# Patient Record
Sex: Female | Born: 1955
Health system: Southern US, Community
[De-identification: ages and names within clinical notes are randomized; demographics above are authoritative.]

## PROBLEM LIST (undated history)

## (undated) DIAGNOSIS — D649 Anemia, unspecified: Secondary | ICD-10-CM

## (undated) DIAGNOSIS — R519 Headache, unspecified: Secondary | ICD-10-CM

## (undated) DIAGNOSIS — Z789 Other specified health status: Secondary | ICD-10-CM

## (undated) DIAGNOSIS — G8929 Other chronic pain: Secondary | ICD-10-CM

## (undated) DIAGNOSIS — J45909 Unspecified asthma, uncomplicated: Secondary | ICD-10-CM

## (undated) DIAGNOSIS — I499 Cardiac arrhythmia, unspecified: Secondary | ICD-10-CM

## (undated) DIAGNOSIS — R51 Headache: Secondary | ICD-10-CM

## (undated) DIAGNOSIS — J302 Other seasonal allergic rhinitis: Secondary | ICD-10-CM

## (undated) DIAGNOSIS — M1990 Unspecified osteoarthritis, unspecified site: Secondary | ICD-10-CM

## (undated) HISTORY — PX: WISDOM TOOTH EXTRACTION: SHX21

## (undated) HISTORY — DX: Headache: R51

## (undated) HISTORY — DX: Headache, unspecified: R51.9

## (undated) HISTORY — PX: COLONOSCOPY: SHX174

## (undated) HISTORY — DX: Other chronic pain: G89.29

## (undated) HISTORY — DX: Unspecified asthma, uncomplicated: J45.909

## (undated) HISTORY — DX: Anemia, unspecified: D64.9

---

## 1975-08-27 DIAGNOSIS — I499 Cardiac arrhythmia, unspecified: Secondary | ICD-10-CM

## 1975-08-27 HISTORY — DX: Cardiac arrhythmia, unspecified: I49.9

## 1999-05-23 ENCOUNTER — Other Ambulatory Visit: Admission: RE | Admit: 1999-05-23 | Discharge: 1999-05-23 | Payer: Self-pay | Admitting: Obstetrics and Gynecology

## 2000-07-15 ENCOUNTER — Other Ambulatory Visit: Admission: RE | Admit: 2000-07-15 | Discharge: 2000-07-15 | Payer: Self-pay | Admitting: Obstetrics and Gynecology

## 2000-07-23 ENCOUNTER — Encounter: Payer: Self-pay | Admitting: Obstetrics and Gynecology

## 2000-07-23 ENCOUNTER — Encounter: Admission: RE | Admit: 2000-07-23 | Discharge: 2000-07-23 | Payer: Self-pay | Admitting: Obstetrics and Gynecology

## 2000-10-14 ENCOUNTER — Other Ambulatory Visit: Admission: RE | Admit: 2000-10-14 | Discharge: 2000-10-14 | Payer: Self-pay | Admitting: Obstetrics and Gynecology

## 2000-11-17 ENCOUNTER — Encounter: Payer: Self-pay | Admitting: Gastroenterology

## 2000-11-17 ENCOUNTER — Encounter: Admission: RE | Admit: 2000-11-17 | Discharge: 2000-11-17 | Payer: Self-pay | Admitting: Gastroenterology

## 2001-08-26 HISTORY — PX: OVARIAN CYST SURGERY: SHX726

## 2002-11-04 ENCOUNTER — Encounter: Payer: Self-pay | Admitting: Obstetrics and Gynecology

## 2002-11-04 ENCOUNTER — Ambulatory Visit (HOSPITAL_COMMUNITY): Admission: RE | Admit: 2002-11-04 | Discharge: 2002-11-04 | Payer: Self-pay | Admitting: Obstetrics and Gynecology

## 2003-08-30 ENCOUNTER — Other Ambulatory Visit: Admission: RE | Admit: 2003-08-30 | Discharge: 2003-08-30 | Payer: Self-pay | Admitting: Obstetrics and Gynecology

## 2003-09-15 ENCOUNTER — Encounter: Admission: RE | Admit: 2003-09-15 | Discharge: 2003-09-15 | Payer: Self-pay | Admitting: Obstetrics and Gynecology

## 2004-11-23 ENCOUNTER — Ambulatory Visit (HOSPITAL_COMMUNITY): Admission: RE | Admit: 2004-11-23 | Discharge: 2004-11-23 | Payer: Self-pay | Admitting: Dermatology

## 2004-12-21 ENCOUNTER — Other Ambulatory Visit: Admission: RE | Admit: 2004-12-21 | Discharge: 2004-12-21 | Payer: Self-pay | Admitting: Obstetrics and Gynecology

## 2006-01-16 ENCOUNTER — Other Ambulatory Visit: Admission: RE | Admit: 2006-01-16 | Discharge: 2006-01-16 | Payer: Self-pay | Admitting: Obstetrics and Gynecology

## 2006-02-11 ENCOUNTER — Encounter: Admission: RE | Admit: 2006-02-11 | Discharge: 2006-02-11 | Payer: Self-pay | Admitting: Obstetrics and Gynecology

## 2006-11-18 ENCOUNTER — Ambulatory Visit (HOSPITAL_COMMUNITY): Admission: RE | Admit: 2006-11-18 | Discharge: 2006-11-18 | Payer: Self-pay | Admitting: Obstetrics and Gynecology

## 2008-10-20 ENCOUNTER — Other Ambulatory Visit: Admission: RE | Admit: 2008-10-20 | Discharge: 2008-10-20 | Payer: Self-pay | Admitting: Obstetrics and Gynecology

## 2008-11-01 ENCOUNTER — Encounter: Admission: RE | Admit: 2008-11-01 | Discharge: 2008-11-01 | Payer: Self-pay | Admitting: Obstetrics and Gynecology

## 2009-11-15 ENCOUNTER — Other Ambulatory Visit: Admission: RE | Admit: 2009-11-15 | Discharge: 2009-11-15 | Payer: Self-pay | Admitting: Obstetrics and Gynecology

## 2013-06-09 ENCOUNTER — Other Ambulatory Visit: Payer: Self-pay | Admitting: Obstetrics & Gynecology

## 2013-06-09 ENCOUNTER — Other Ambulatory Visit (HOSPITAL_COMMUNITY)
Admission: RE | Admit: 2013-06-09 | Discharge: 2013-06-09 | Disposition: A | Discharge status: Home / Self Care | Payer: 59 | Source: Ambulatory Visit | Attending: Obstetrics & Gynecology | Admitting: Obstetrics & Gynecology

## 2013-06-09 DIAGNOSIS — Z01419 Encounter for gynecological examination (general) (routine) without abnormal findings: Secondary | ICD-10-CM | POA: Insufficient documentation

## 2013-06-09 DIAGNOSIS — Z1151 Encounter for screening for human papillomavirus (HPV): Secondary | ICD-10-CM | POA: Insufficient documentation

## 2014-08-04 ENCOUNTER — Other Ambulatory Visit: Payer: Self-pay

## 2014-08-04 DIAGNOSIS — Z1231 Encounter for screening mammogram for malignant neoplasm of breast: Secondary | ICD-10-CM

## 2014-08-25 ENCOUNTER — Ambulatory Visit: Payer: 59

## 2014-08-26 HISTORY — PX: BREAST EXCISIONAL BIOPSY: SUR124

## 2014-09-08 ENCOUNTER — Ambulatory Visit
Admission: RE | Admit: 2014-09-08 | Discharge: 2014-09-08 | Disposition: A | Discharge status: Home / Self Care | Payer: 59 | Source: Ambulatory Visit

## 2014-09-08 DIAGNOSIS — Z1231 Encounter for screening mammogram for malignant neoplasm of breast: Secondary | ICD-10-CM

## 2014-09-09 ENCOUNTER — Other Ambulatory Visit: Payer: Self-pay | Admitting: Obstetrics & Gynecology

## 2014-09-09 DIAGNOSIS — R928 Other abnormal and inconclusive findings on diagnostic imaging of breast: Secondary | ICD-10-CM

## 2014-09-21 ENCOUNTER — Ambulatory Visit
Admission: RE | Admit: 2014-09-21 | Discharge: 2014-09-21 | Disposition: A | Discharge status: Home / Self Care | Payer: 59 | Source: Ambulatory Visit | Attending: Obstetrics & Gynecology | Admitting: Obstetrics & Gynecology

## 2014-09-21 ENCOUNTER — Other Ambulatory Visit: Payer: Self-pay | Admitting: Obstetrics & Gynecology

## 2014-09-21 DIAGNOSIS — R928 Other abnormal and inconclusive findings on diagnostic imaging of breast: Secondary | ICD-10-CM

## 2014-10-03 ENCOUNTER — Other Ambulatory Visit: Payer: Self-pay | Admitting: Obstetrics & Gynecology

## 2014-10-03 DIAGNOSIS — R928 Other abnormal and inconclusive findings on diagnostic imaging of breast: Secondary | ICD-10-CM

## 2014-10-07 ENCOUNTER — Ambulatory Visit
Admission: RE | Admit: 2014-10-07 | Discharge: 2014-10-07 | Disposition: A | Discharge status: Home / Self Care | Payer: 59 | Source: Ambulatory Visit | Attending: Obstetrics & Gynecology | Admitting: Obstetrics & Gynecology

## 2014-10-07 DIAGNOSIS — R928 Other abnormal and inconclusive findings on diagnostic imaging of breast: Secondary | ICD-10-CM

## 2014-10-24 ENCOUNTER — Other Ambulatory Visit (INDEPENDENT_AMBULATORY_CARE_PROVIDER_SITE_OTHER): Payer: Self-pay | Admitting: General Surgery

## 2014-10-24 ENCOUNTER — Other Ambulatory Visit (INDEPENDENT_AMBULATORY_CARE_PROVIDER_SITE_OTHER): Payer: Self-pay

## 2014-10-24 DIAGNOSIS — N6452 Nipple discharge: Secondary | ICD-10-CM

## 2014-10-26 ENCOUNTER — Other Ambulatory Visit: Payer: 59

## 2014-10-31 ENCOUNTER — Other Ambulatory Visit (INDEPENDENT_AMBULATORY_CARE_PROVIDER_SITE_OTHER): Payer: Self-pay | Admitting: General Surgery

## 2014-10-31 DIAGNOSIS — N6021 Fibroadenosis of right breast: Secondary | ICD-10-CM

## 2014-11-01 ENCOUNTER — Other Ambulatory Visit: Payer: Self-pay | Admitting: General Surgery

## 2014-11-01 DIAGNOSIS — N6021 Fibroadenosis of right breast: Secondary | ICD-10-CM

## 2014-11-02 ENCOUNTER — Encounter (HOSPITAL_BASED_OUTPATIENT_CLINIC_OR_DEPARTMENT_OTHER): Payer: Self-pay | Admitting: *Deleted

## 2014-11-02 NOTE — Progress Notes (Signed)
Pt fairly nervous-no pcp-just GYN-no meds Hx cardiac arrythmia age 59-was low K No meds-no issue since No labs needed-did have labs 12/15 at gyn

## 2014-11-07 ENCOUNTER — Encounter (HOSPITAL_BASED_OUTPATIENT_CLINIC_OR_DEPARTMENT_OTHER)
Admission: RE | Disposition: A | Discharge status: Home / Self Care | Payer: Self-pay | Source: Ambulatory Visit | Attending: General Surgery

## 2014-11-07 ENCOUNTER — Ambulatory Visit (HOSPITAL_BASED_OUTPATIENT_CLINIC_OR_DEPARTMENT_OTHER): Payer: 59

## 2014-11-07 ENCOUNTER — Ambulatory Visit
Admission: RE | Admit: 2014-11-07 | Discharge: 2014-11-07 | Disposition: A | Discharge status: Home / Self Care | Payer: 59 | Source: Ambulatory Visit | Attending: General Surgery | Admitting: General Surgery

## 2014-11-07 ENCOUNTER — Ambulatory Visit (HOSPITAL_BASED_OUTPATIENT_CLINIC_OR_DEPARTMENT_OTHER)
Admission: RE | Admit: 2014-11-07 | Discharge: 2014-11-07 | Disposition: A | Discharge status: Home / Self Care | Payer: 59 | Source: Ambulatory Visit | Attending: General Surgery | Admitting: General Surgery

## 2014-11-07 ENCOUNTER — Encounter (HOSPITAL_BASED_OUTPATIENT_CLINIC_OR_DEPARTMENT_OTHER): Payer: Self-pay | Admitting: *Deleted

## 2014-11-07 DIAGNOSIS — Z880 Allergy status to penicillin: Secondary | ICD-10-CM | POA: Insufficient documentation

## 2014-11-07 DIAGNOSIS — Z87891 Personal history of nicotine dependence: Secondary | ICD-10-CM | POA: Diagnosis not present

## 2014-11-07 DIAGNOSIS — N6011 Diffuse cystic mastopathy of right breast: Secondary | ICD-10-CM | POA: Diagnosis not present

## 2014-11-07 DIAGNOSIS — D241 Benign neoplasm of right breast: Secondary | ICD-10-CM | POA: Insufficient documentation

## 2014-11-07 DIAGNOSIS — N6091 Unspecified benign mammary dysplasia of right breast: Secondary | ICD-10-CM | POA: Diagnosis not present

## 2014-11-07 DIAGNOSIS — G43909 Migraine, unspecified, not intractable, without status migrainosus: Secondary | ICD-10-CM | POA: Insufficient documentation

## 2014-11-07 DIAGNOSIS — J45909 Unspecified asthma, uncomplicated: Secondary | ICD-10-CM | POA: Insufficient documentation

## 2014-11-07 DIAGNOSIS — M1990 Unspecified osteoarthritis, unspecified site: Secondary | ICD-10-CM | POA: Insufficient documentation

## 2014-11-07 DIAGNOSIS — Z886 Allergy status to analgesic agent status: Secondary | ICD-10-CM | POA: Insufficient documentation

## 2014-11-07 DIAGNOSIS — N6021 Fibroadenosis of right breast: Secondary | ICD-10-CM

## 2014-11-07 DIAGNOSIS — N6489 Other specified disorders of breast: Secondary | ICD-10-CM | POA: Insufficient documentation

## 2014-11-07 DIAGNOSIS — Z888 Allergy status to other drugs, medicaments and biological substances status: Secondary | ICD-10-CM | POA: Diagnosis not present

## 2014-11-07 HISTORY — DX: Other seasonal allergic rhinitis: J30.2

## 2014-11-07 HISTORY — PX: BREAST LUMPECTOMY WITH RADIOACTIVE SEED LOCALIZATION: SHX6424

## 2014-11-07 HISTORY — DX: Unspecified osteoarthritis, unspecified site: M19.90

## 2014-11-07 HISTORY — DX: Cardiac arrhythmia, unspecified: I49.9

## 2014-11-07 HISTORY — DX: Other specified health status: Z78.9

## 2014-11-07 LAB — POCT HEMOGLOBIN-HEMACUE: Hemoglobin: 14.8 g/dL (ref 12.0–15.0)

## 2014-11-07 SURGERY — BREAST LUMPECTOMY WITH RADIOACTIVE SEED LOCALIZATION
Anesthesia: General | Site: Breast | Laterality: Right

## 2014-11-07 MED ORDER — DEXAMETHASONE SODIUM PHOSPHATE 4 MG/ML IJ SOLN
INTRAMUSCULAR | Status: DC | PRN
  Administered 2014-11-07: 10 mg via INTRAVENOUS

## 2014-11-07 MED ORDER — OXYCODONE HCL 5 MG PO TABS
5.0000 mg | ORAL_TABLET | Freq: Once | ORAL | Status: AC | PRN
  Administered 2014-11-07: 5 mg via ORAL

## 2014-11-07 MED ORDER — CEFAZOLIN SODIUM-DEXTROSE 2-3 GM-% IV SOLR
INTRAVENOUS | Status: AC
  Filled 2014-11-07: qty 50

## 2014-11-07 MED ORDER — OXYCODONE-ACETAMINOPHEN 5-325 MG PO TABS: 1.0000 | ORAL_TABLET | ORAL | Status: DC | PRN

## 2014-11-07 MED ORDER — BUPIVACAINE-EPINEPHRINE (PF) 0.25% -1:200000 IJ SOLN
INTRAMUSCULAR | Status: AC
  Filled 2014-11-07: qty 30

## 2014-11-07 MED ORDER — ONDANSETRON HCL 4 MG/2ML IJ SOLN: 4.0000 mg | Freq: Four times a day (QID) | INTRAMUSCULAR | Status: DC | PRN

## 2014-11-07 MED ORDER — FENTANYL CITRATE 0.05 MG/ML IJ SOLN
25.0000 ug | INTRAMUSCULAR | Status: DC | PRN
  Administered 2014-11-07: 50 ug via INTRAVENOUS
  Administered 2014-11-07: 25 ug via INTRAVENOUS

## 2014-11-07 MED ORDER — FENTANYL CITRATE 0.05 MG/ML IJ SOLN: 50.0000 ug | INTRAMUSCULAR | Status: DC | PRN

## 2014-11-07 MED ORDER — BUPIVACAINE-EPINEPHRINE (PF) 0.25% -1:200000 IJ SOLN
INTRAMUSCULAR | Status: DC | PRN
  Administered 2014-11-07: 18 mL via PERINEURAL

## 2014-11-07 MED ORDER — FENTANYL CITRATE 0.05 MG/ML IJ SOLN
INTRAMUSCULAR | Status: AC
  Filled 2014-11-07: qty 2

## 2014-11-07 MED ORDER — MIDAZOLAM HCL 2 MG/2ML IJ SOLN: 1.0000 mg | INTRAMUSCULAR | Status: DC | PRN

## 2014-11-07 MED ORDER — PROPOFOL 10 MG/ML IV BOLUS
INTRAVENOUS | Status: DC | PRN
  Administered 2014-11-07: 180 mg via INTRAVENOUS

## 2014-11-07 MED ORDER — CEFAZOLIN SODIUM-DEXTROSE 2-3 GM-% IV SOLR
INTRAVENOUS | Status: DC | PRN
  Administered 2014-11-07: 2 g via INTRAVENOUS

## 2014-11-07 MED ORDER — FENTANYL CITRATE 0.05 MG/ML IJ SOLN
INTRAMUSCULAR | Status: AC
  Filled 2014-11-07: qty 6

## 2014-11-07 MED ORDER — OXYCODONE HCL 5 MG PO TABS
ORAL_TABLET | ORAL | Status: AC
  Filled 2014-11-07: qty 1

## 2014-11-07 MED ORDER — LIDOCAINE HCL (CARDIAC) 20 MG/ML IV SOLN
INTRAVENOUS | Status: DC | PRN
  Administered 2014-11-07: 80 mg via INTRAVENOUS

## 2014-11-07 MED ORDER — MIDAZOLAM HCL 5 MG/5ML IJ SOLN
INTRAMUSCULAR | Status: DC | PRN
  Administered 2014-11-07: 2 mg via INTRAVENOUS

## 2014-11-07 MED ORDER — BUPIVACAINE HCL (PF) 0.25 % IJ SOLN
INTRAMUSCULAR | Status: AC
  Filled 2014-11-07: qty 30

## 2014-11-07 MED ORDER — LACTATED RINGERS IV SOLN
INTRAVENOUS | Status: DC
  Administered 2014-11-07 (×3): via INTRAVENOUS

## 2014-11-07 MED ORDER — ONDANSETRON HCL 4 MG/2ML IJ SOLN
INTRAMUSCULAR | Status: DC | PRN
  Administered 2014-11-07: 4 mg via INTRAVENOUS

## 2014-11-07 MED ORDER — MIDAZOLAM HCL 2 MG/2ML IJ SOLN
INTRAMUSCULAR | Status: AC
  Filled 2014-11-07: qty 2

## 2014-11-07 MED ORDER — FENTANYL CITRATE 0.05 MG/ML IJ SOLN
INTRAMUSCULAR | Status: DC | PRN
  Administered 2014-11-07 (×2): 50 ug via INTRAVENOUS

## 2014-11-07 MED ORDER — OXYCODONE HCL 5 MG/5ML PO SOLN: 5.0000 mg | Freq: Once | ORAL | Status: AC | PRN

## 2014-11-07 SURGICAL SUPPLY — 42 items
APPLIER CLIP 9.375 MED OPEN (MISCELLANEOUS)
APR CLP MED 9.3 20 MLT OPN (MISCELLANEOUS)
BLADE SURG 15 STRL LF DISP TIS (BLADE) ×1 IMPLANT
BLADE SURG 15 STRL SS (BLADE) ×2
CANISTER SUC SOCK COL 7IN (MISCELLANEOUS) ×2 IMPLANT
CANISTER SUCT 1200ML W/VALVE (MISCELLANEOUS) ×2 IMPLANT
CHLORAPREP W/TINT 26ML (MISCELLANEOUS) ×2 IMPLANT
CLIP APPLIE 9.375 MED OPEN (MISCELLANEOUS) ×1 IMPLANT
COVER BACK TABLE 60X90IN (DRAPES) ×2 IMPLANT
COVER MAYO STAND STRL (DRAPES) ×2 IMPLANT
COVER PROBE W GEL 5X96 (DRAPES) ×2 IMPLANT
DECANTER SPIKE VIAL GLASS SM (MISCELLANEOUS) IMPLANT
DEVICE DUBIN W/COMP PLATE 8390 (MISCELLANEOUS) ×2 IMPLANT
DRAPE LAPAROSCOPIC ABDOMINAL (DRAPES) ×1 IMPLANT
DRAPE UTILITY XL STRL (DRAPES) ×3 IMPLANT
ELECT COATED BLADE 2.86 ST (ELECTRODE) ×2 IMPLANT
ELECT REM PT RETURN 9FT ADLT (ELECTROSURGICAL) ×2
ELECTRODE REM PT RTRN 9FT ADLT (ELECTROSURGICAL) ×1 IMPLANT
GLOVE BIO SURGEON STRL SZ 6.5 (GLOVE) ×1 IMPLANT
GLOVE BIO SURGEON STRL SZ7.5 (GLOVE) ×3 IMPLANT
GLOVE BIOGEL PI IND STRL 7.0 (GLOVE) IMPLANT
GLOVE BIOGEL PI INDICATOR 7.0 (GLOVE) ×1
GLOVE ECLIPSE 6.5 STRL STRAW (GLOVE) ×1 IMPLANT
GOWN STRL REUS W/ TWL LRG LVL3 (GOWN DISPOSABLE) ×2 IMPLANT
GOWN STRL REUS W/TWL LRG LVL3 (GOWN DISPOSABLE) ×4
KIT MARKER MARGIN INK (KITS) ×1 IMPLANT
LIQUID BAND (GAUZE/BANDAGES/DRESSINGS) ×2 IMPLANT
NDL HYPO 25X1 1.5 SAFETY (NEEDLE) IMPLANT
NEEDLE HYPO 25X1 1.5 SAFETY (NEEDLE) ×2 IMPLANT
NS IRRIG 1000ML POUR BTL (IV SOLUTION) ×1 IMPLANT
PACK BASIN DAY SURGERY FS (CUSTOM PROCEDURE TRAY) ×2 IMPLANT
PENCIL BUTTON HOLSTER BLD 10FT (ELECTRODE) ×2 IMPLANT
SLEEVE SCD COMPRESS KNEE MED (MISCELLANEOUS) ×2 IMPLANT
SPONGE LAP 18X18 X RAY DECT (DISPOSABLE) ×2 IMPLANT
SUT MON AB 4-0 PC3 18 (SUTURE) ×1 IMPLANT
SUT SILK 2 0 SH (SUTURE) ×1 IMPLANT
SUT VICRYL 3-0 CR8 SH (SUTURE) ×2 IMPLANT
SYR CONTROL 10ML LL (SYRINGE) ×1 IMPLANT
TOWEL OR 17X24 6PK STRL BLUE (TOWEL DISPOSABLE) ×2 IMPLANT
TOWEL OR NON WOVEN STRL DISP B (DISPOSABLE) ×2 IMPLANT
TUBE CONNECTING 20X1/4 (TUBING) ×2 IMPLANT
YANKAUER SUCT BULB TIP NO VENT (SUCTIONS) ×1 IMPLANT

## 2014-11-07 NOTE — Transfer of Care (Signed)
Immediate Anesthesia Transfer of Care Note  Patient: Rachel Wade  Procedure(s) Performed: Procedure(s): BREAST LUMPECTOMY WITH RADIOACTIVE SEED LOCALIZATION (Right)  Patient Location: PACU  Anesthesia Type:General  Level of Consciousness: oriented and patient cooperative  Airway & Oxygen Therapy: Patient Spontanous Breathing and Patient connected to face mask oxygen  Post-op Assessment: Report given to RN and Post -op Vital signs reviewed and stable  Post vital signs: Reviewed and stable  Last Vitals:  Filed Vitals:   11/07/14 1311  BP: 125/72  Pulse: 77  Temp: 36.8 C  Resp: 20    Complications: No apparent anesthesia complications

## 2014-11-07 NOTE — Anesthesia Procedure Notes (Signed)
Procedure Name: LMA Insertion Date/Time: 11/07/2014 2:42 PM Performed by: Lyndee Leo Pre-anesthesia Checklist: Patient identified, Emergency Drugs available, Suction available and Patient being monitored Patient Re-evaluated:Patient Re-evaluated prior to inductionOxygen Delivery Method: Circle System Utilized Preoxygenation: Pre-oxygenation with 100% oxygen Intubation Type: IV induction Ventilation: Mask ventilation without difficulty LMA: LMA inserted LMA Size: 4.0 Number of attempts: 1 Airway Equipment and Method: Bite block Placement Confirmation: positive ETCO2 Tube secured with: Tape Dental Injury: Teeth and Oropharynx as per pre-operative assessment

## 2014-11-07 NOTE — Discharge Instructions (Signed)

## 2014-11-07 NOTE — Anesthesia Preprocedure Evaluation (Signed)
Anesthesia Evaluation  Patient identified by MRN, date of birth, ID band Patient awake    Reviewed: Allergy & Precautions, NPO status , Patient's Chart, lab work & pertinent test results  Airway Mallampati: II   Neck ROM: full    Dental   Pulmonary former smoker,          Cardiovascular negative cardio ROS      Neuro/Psych    GI/Hepatic   Endo/Other    Renal/GU      Musculoskeletal  (+) Arthritis -,   Abdominal   Peds  Hematology   Anesthesia Other Findings   Reproductive/Obstetrics                             Anesthesia Physical Anesthesia Plan  ASA: II  Anesthesia Plan: General   Post-op Pain Management:    Induction: Intravenous  Airway Management Planned: LMA  Additional Equipment:   Intra-op Plan:   Post-operative Plan:   Informed Consent: I have reviewed the patients History and Physical, chart, labs and discussed the procedure including the risks, benefits and alternatives for the proposed anesthesia with the patient or authorized representative who has indicated his/her understanding and acceptance.     Plan Discussed with: CRNA, Anesthesiologist and Surgeon  Anesthesia Plan Comments:         Anesthesia Quick Evaluation

## 2014-11-07 NOTE — Anesthesia Postprocedure Evaluation (Signed)
Anesthesia Post Note  Patient: Rachel Wade  Procedure(s) Performed: Procedure(s) (LRB): BREAST LUMPECTOMY WITH RADIOACTIVE SEED LOCALIZATION (Right)  Anesthesia type: General  Patient location: PACU  Post pain: Pain level controlled and Adequate analgesia  Post assessment: Post-op Vital signs reviewed, Patient's Cardiovascular Status Stable, Respiratory Function Stable, Patent Airway and Pain level controlled  Last Vitals:  Filed Vitals:   11/07/14 1600  BP: 130/70  Pulse: 76  Temp:   Resp: 16    Post vital signs: Reviewed and stable  Level of consciousness: awake, alert  and oriented  Complications: No apparent anesthesia complications

## 2014-11-07 NOTE — Op Note (Signed)
11/07/2014  3:40 PM  PATIENT:  Rachel Wade  59 y.o. female  PRE-OPERATIVE DIAGNOSIS:  Right Breast Lesion  POST-OPERATIVE DIAGNOSIS:  Right Breast Lesion  PROCEDURE:  Procedure(s): BREAST LUMPECTOMY WITH RADIOACTIVE SEED LOCALIZATION (Right)  SURGEON:  Surgeon(s) and Role:    * Jovita Kussmaul, MD - Primary  PHYSICIAN ASSISTANT:   ASSISTANTS: none   ANESTHESIA:   general  EBL:  Total I/O In: 1000 [I.V.:1000] Out: -   BLOOD ADMINISTERED:none  DRAINS: none   LOCAL MEDICATIONS USED:  MARCAINE     SPECIMEN:  Source of Specimen:  right breast tissue  DISPOSITION OF SPECIMEN:  PATHOLOGY  COUNTS:  YES  TOURNIQUET:  * No tourniquets in log *  DICTATION: .Dragon Dictation  After informed consent was obtained the patient was brought to the operating room and placed in the supine position on the operating room table. After adequate induction of general anesthesia the patient's right breast was prepped with ChloraPrep, allowed to dry, and draped in usual sterile manner. Previously a radioactive I-125 seed was placed in the outer aspect of the right breast to mark an area of a complex sclerosing lesion. The radioactive seed was located using the neoprobe set to I-125. A transversely oriented incision was made with a 15 blade knife overlying the area of radioactivity in the lateral right breast. This incision was carried through the skin and subcutaneous tissue sharply with electrocautery. Once into the breast tissue the location of the seed was determined using the neoprobe. While checking the radioactivity frequently with the neoprobe a circular portion of breast tissue was excised sharply around this area of radioactivity. Once the specimen was removed it was oriented with a short single stitch on the superior surface, a long single stitch on the lateral surface, and a double stitch on the deep surface. Radioactivity was confirmed in the specimen. There was no residual radioactivity in  the right breast. A specimen radiograph showed the clip and seed to be in the center of the specimen. The specimen was then sent to pathology for further evaluation. The wound was infiltrated with quarter percent Marcaine and irrigated with copious amounts of saline. The deep layer of the wound was closed with interrupted 3-0 Vicryl stitches. The skin was then closed with interrupted 4-0 Monocryl subcuticular stitches. Dermabond dressings were applied. The patient tolerated the procedure well. At the end of the case all needle sponge and instrument counts were correct. The patient was then awakened and taken to recovery in stable condition.  PLAN OF CARE: Discharge to home after PACU  PATIENT DISPOSITION:  PACU - hemodynamically stable.   Delay start of Pharmacological VTE agent (>24hrs) due to surgical blood loss or risk of bleeding: not applicable

## 2014-11-07 NOTE — Interval H&P Note (Signed)
History and Physical Interval Note:  11/07/2014 7:24 AM  Rachel Wade  has presented today for surgery, with the diagnosis of Right Breast Lesion  The various methods of treatment have been discussed with the patient and family. After consideration of risks, benefits and other options for treatment, the patient has consented to  Procedure(s): BREAST LUMPECTOMY WITH RADIOACTIVE SEED LOCALIZATION (Right) as a surgical intervention .  The patient's history has been reviewed, patient examined, no change in status, stable for surgery.  I have reviewed the patient's chart and labs.  Questions were answered to the patient's satisfaction.     TOTH III,Almeta Geisel S

## 2014-11-07 NOTE — H&P (Signed)
Rachel Wade 10/24/2014 3:10 PM Location: [facility] Patient #: [id number] DOB: [birth year] Married / Language: English / Race: White Female  History of Present Illness [name]. [name] MD; 10/25/2014 8:51 AM) Patient words: breast lesion.  The patient is a 59 year old female who presents with a breast mass. We are asked to see the patient in consultation by Dr. Luberta Robertson to evaluate her for a abnormality in the right breast. The patient is a 59 year old white female who has not had a mammogram in several years. She recently went for a routine screening mammogram at which time a mass was seen in the 10 o'clock position of the right breast. This was biopsied and came back as a sclerosing lesion. The patient does do regular self exams and has had several lumps on each side for her entire life. This was always felt to be fibrocystic tissue. Her only family history of breast cancer is in a maternal cousin. She does not take any hormone replacement.   Other Problems (Ammie Eversole, LPN; 03/14/9469 9:62 PM) Arthritis Asthma Lump In Breast Migraine Headache  Past Surgical History (Ammie Eversole, LPN; 8/36/6294 7:65 PM) Breast Biopsy Bilateral. multiple  Diagnostic Studies History (Ammie Eversole, LPN; 4/65/0354 6:56 PM) Colonoscopy 5-10 years ago Mammogram within last year Pap Smear 1-5 years ago  Allergies (Ammie Eversole, LPN; 04/06/7516 0:01 PM) Compazine *ANTIPSYCHOTICS/ANTIMANIC AGENTS* chemical stroke Penicillins Lidocaine *CHEMICALS* fainting  Medication History (Ammie Eversole, LPN; 7/49/4496 7:59 PM) Vitamin D (1000UNIT Capsule, Oral as needed) Active. Vitamin C (Oral as needed) Active.  Social History (Ammie Eversole, LPN; 1/63/8466 5:99 PM) Alcohol use Occasional alcohol use. Caffeine use Coffee, Tea. Illicit drug use Remotely quit drug use. Tobacco use Former smoker.  Family History Aleatha Borer, LPN; 3/57/0177 9:39 PM) Alcohol  Abuse Sister. Arthritis Mother. Cerebrovascular Accident Father. Heart Disease Father. Hypertension Father.  Pregnancy / Birth History Aleatha Borer, LPN; 0/30/0923 3:00 PM) Age at menarche 61 years. Age of menopause 40-55 Gravida 1 Maternal age 71-40 Para 1  Review of Systems (Ammie Eversole LPN; 7/62/2633 3:54 PM) General Not Present- Appetite Loss, Chills, Fatigue, Fever, Night Sweats, Weight Gain and Weight Loss. Skin Present- Rash. Not Present- Change in Wart/Mole, Dryness, Hives, Jaundice, New Lesions, Non-Healing Wounds and Ulcer. HEENT Present- Seasonal Allergies. Not Present- Earache, Hearing Loss, Hoarseness, Nose Bleed, Oral Ulcers, Ringing in the Ears, Sinus Pain, Sore Throat, Visual Disturbances, Wears glasses/contact lenses and Yellow Eyes. Respiratory Present- Snoring. Not Present- Bloody sputum, Chronic Cough, Difficulty Breathing and Wheezing. Breast Present- Breast Mass. Not Present- Breast Pain, Nipple Discharge and Skin Changes. Gastrointestinal Not Present- Abdominal Pain, Bloating, Bloody Stool, Change in Bowel Habits, Chronic diarrhea, Constipation, Difficulty Swallowing, Excessive gas, Gets full quickly at meals, Hemorrhoids, Indigestion, Nausea, Rectal Pain and Vomiting. Female Genitourinary Not Present- Frequency, Nocturia, Painful Urination, Pelvic Pain and Urgency. Neurological Not Present- Decreased Memory, Fainting, Headaches, Numbness, Seizures, Tingling, Tremor, Trouble walking and Weakness. Psychiatric Not Present- Anxiety, Bipolar, Change in Sleep Pattern, Depression, Fearful and Frequent crying. Endocrine Present- Hot flashes. Not Present- Cold Intolerance, Excessive Hunger, Hair Changes, Heat Intolerance and New Diabetes. Hematology Not Present- Easy Bruising, Excessive bleeding, Gland problems, HIV and Persistent Infections.   Vitals (Ammie Eversole LPN; 5/62/5638 9:37 PM) 10/24/2014 3:11 PM Weight: 183 lb Height: 66in Body Surface  Area: 1.97 m Body Mass Index: 29.54 kg/m Temp.: 98.10F(Oral)  Pulse: 88 (Regular)  BP: 122/84 (Sitting, Left Arm, Standard)    Physical Exam [name] S. [name] MD; 10/25/2014 8:51 AM) General  Mental Status-Alert. General Appearance-Consistent with stated age. Hydration-Well hydrated. Voice-Normal.  Head and Neck Head-normocephalic, atraumatic with no lesions or palpable masses. Trachea-midline. Thyroid Gland Characteristics - normal size and consistency.  Eye Eyeball - Bilateral-Extraocular movements intact. Sclera/Conjunctiva - Bilateral-No scleral icterus.  Chest and Lung Exam Chest and lung exam reveals -quiet, even and easy respiratory effort with no use of accessory muscles and on auscultation, normal breath sounds, no adventitious sounds and normal vocal resonance. Inspection Chest Wall - Normal. Back - normal.  Breast Note: There is no palpable mass in either breast. There is no palpable axillary, supraclavicular, or cervical lymphadenopathy   Cardiovascular Cardiovascular examination reveals -normal heart sounds, regular rate and rhythm with no murmurs and normal pedal pulses bilaterally.  Abdomen Inspection Inspection of the abdomen reveals - No Hernias. Skin - Scar - no surgical scars. Palpation/Percussion Palpation and Percussion of the abdomen reveal - Soft, Non Tender, No Rebound tenderness, No Rigidity (guarding) and No hepatosplenomegaly. Auscultation Auscultation of the abdomen reveals - Bowel sounds normal.  Neurologic Neurologic evaluation reveals -alert and oriented x 3 with no impairment of recent or remote memory. Mental Status-Normal.  Musculoskeletal Normal Exam - Left-Upper Extremity Strength Normal and Lower Extremity Strength Normal. Normal Exam - Right-Upper Extremity Strength Normal and Lower Extremity Strength Normal.  Lymphatic Head & Neck  General Head & Neck Lymphatics: Bilateral - Description -  Normal. Axillary  General Axillary Region: Bilateral - Description - Normal. Tenderness - Non Tender. Femoral & Inguinal  Generalized Femoral & Inguinal Lymphatics: Bilateral - Description - Normal. Tenderness - Non Tender.    Assessment & Plan [name] S. [name] MD; 10/24/2014 4:28 PM) SCLEROSING ADENOSIS OF BREAST, RIGHT (610.2  N60.21) Impression: The patient appears to have a sclerosing lesion in the 10 o'clock position of the right breast. Because of its appearance on mammogram and because this is considered a high risk lesion I would recommend having this area removed. I have discussed with her in detail the risks and benefits of the operation to remove this lesion as well as some of the technical aspects and she understands and wishes to proceed. We will plan for a right breast radioactive seed localized lumpectomy     Signed by Luella Cook, MD (10/25/2014 8:52 AM)

## 2014-11-08 ENCOUNTER — Encounter (HOSPITAL_BASED_OUTPATIENT_CLINIC_OR_DEPARTMENT_OTHER): Payer: Self-pay | Admitting: General Surgery

## 2015-08-01 ENCOUNTER — Ambulatory Visit
Admission: RE | Admit: 2015-08-01 | Discharge: 2015-08-01 | Disposition: A | Discharge status: Home / Self Care | Payer: 59 | Source: Ambulatory Visit | Attending: Family Medicine | Admitting: Family Medicine

## 2015-08-01 ENCOUNTER — Ambulatory Visit (INDEPENDENT_AMBULATORY_CARE_PROVIDER_SITE_OTHER): Payer: 59 | Admitting: Family Medicine

## 2015-08-01 ENCOUNTER — Encounter: Payer: Self-pay | Admitting: Family Medicine

## 2015-08-01 VITALS — BP 114/73 | HR 75 | Temp 98.8°F | Resp 16 | Ht 69.0 in | Wt 184.6 lb

## 2015-08-01 DIAGNOSIS — J40 Bronchitis, not specified as acute or chronic: Secondary | ICD-10-CM | POA: Diagnosis not present

## 2015-08-01 MED ORDER — AZITHROMYCIN 250 MG PO TABS: ORAL_TABLET | ORAL | Status: AC

## 2015-08-01 MED ORDER — DM-GUAIFENESIN ER 30-600 MG PO TB12: 1.0000 | ORAL_TABLET | Freq: Two times a day (BID) | ORAL | Status: DC

## 2015-08-01 MED ORDER — ALBUTEROL SULFATE HFA 108 (90 BASE) MCG/ACT IN AERS: 2.0000 | INHALATION_SPRAY | Freq: Four times a day (QID) | RESPIRATORY_TRACT | Status: AC | PRN

## 2015-08-01 MED ORDER — LORATADINE 10 MG PO TABS
10.0000 mg | ORAL_TABLET | Freq: Every day | ORAL | Status: AC
Start: 2015-08-01 — End: ?

## 2015-08-01 NOTE — Progress Notes (Signed)
Name: Rachel Wade   MRN: [id number]    DOB: 02-19-1956   Date:08/01/2015       Progress Note  Subjective  Chief Complaint  Chief Complaint  Patient presents with  . Cough    2 weeks     HPI Here c/o 2 weeks of nasal congestion, mild sore throat, cough.  Cough productive of yellow sputum.  Some mild SOB.  No fever since first sick.  Some rattling in chest.  Hx of asthma as a child.  No problem-specific assessment & plan notes found for this encounter.   Past Medical History  Diagnosis Date  . Medical history non-contributory   . Dysrhythmia 1977    when she was 20-? cardiac arrest-[facility]-dx low Potasium  . Seasonal allergies   . Arthritis     states Rheumatoid in hands  . Asthma   . Anemia   . Chronic headaches     Social History  Substance Use Topics  . Smoking status: Former Smoker    Quit date: 11/02/1975  . Smokeless tobacco: Not on file  . Alcohol Use: Yes     Comment: occ-rare     Current outpatient prescriptions:  .  Calcium Carb-Cholecalciferol (CALCIUM + D3 PO), Take by mouth., Disp: , Rfl:  .  cholecalciferol (VITAMIN D) 1000 UNITS tablet, Take 1,000 Units by mouth daily., Disp: , Rfl:  .  Homeopathic Products (ZICAM COLD REMEDY NA), Place into the nose., Disp: , Rfl:   Allergies  Allergen Reactions  . Penicillins     unknown  . Erythromycin Nausea And Vomiting    All MYCINS make her sick    Review of Systems  Constitutional: Negative for fever, chills, weight loss and malaise/fatigue.  Eyes: Negative for blurred vision and double vision.  Respiratory: Positive for cough, sputum production and shortness of breath. Wheezing: /-   Cardiovascular: Negative for chest pain, palpitations and leg swelling.  Gastrointestinal: Positive for heartburn (olcc. antacid use). Negative for abdominal pain and blood in stool.  Genitourinary: Negative for dysuria, urgency and frequency.  Musculoskeletal: Negative for myalgias and joint pain.  Skin: Negative for  rash.  Neurological: Negative for dizziness, weakness and headaches.      Objective  Filed Vitals:   08/01/15 1029  BP: 114/73  Pulse: 75  Temp: 98.8 F (37.1 C)  Resp: 16  Height: 5\' 9"  (1.753 m)  Weight: 184 lb 9.6 oz (83.734 kg)  SpO2: 97%     Physical Exam  Constitutional: She is oriented to person, place, and time and well-developed, well-nourished, and in no distress. No distress.  HENT:  Head: Normocephalic and atraumatic.  Right Ear: External ear normal.  Left Ear: External ear normal.  Nose: Mucosal edema and rhinorrhea present.  Mouth/Throat: No oropharyngeal exudate, posterior oropharyngeal edema or posterior oropharyngeal erythema.  Neck: Normal range of motion. Neck supple. No thyromegaly present.  Cardiovascular: Normal rate, regular rhythm and normal heart sounds.  Exam reveals no gallop and no friction rub.   No murmur heard. Pulmonary/Chest: Effort normal. No respiratory distress. She has no wheezes. Rales: faint rales at bilateral bases.  Lymphadenopathy:    She has no cervical adenopathy.  Neurological: She is alert and oriented to person, place, and time.  Vitals reviewed.     No results found for this or any previous visit (from the past 2160 hour(s)).   Assessment & Plan  1. Bronchitis  - DG Chest 2 View; Future - azithromycin (ZITHROMAX) 250 MG tablet; Take 2  tablets on day 1, then 1 tablet daily on days 2-5  Dispense: 6 tablet; Refill: 0 - albuterol (PROVENTIL HFA;VENTOLIN HFA) 108 (90 BASE) MCG/ACT inhaler; Inhale 2 puffs into the lungs every 6 (six) hours as needed for wheezing or shortness of breath.  Dispense: 1 Inhaler; Refill: 3 - dextromethorphan-guaiFENesin (MUCINEX DM) 30-600 MG 12hr tablet; Take 1 tablet by mouth 2 (two) times daily.  Dispense: 20 tablet; Refill: 1 - loratadine (CLARITIN) 10 MG tablet; Take 1 tablet (10 mg total) by mouth daily.  Dispense: 30 tablet; Refill: 11

## 2015-08-04 ENCOUNTER — Telehealth: Payer: Self-pay | Admitting: Family Medicine

## 2015-08-04 NOTE — Telephone Encounter (Signed)
Pt states that her  Proventil was causing her bad headaches, wanted to know if you would call her something in . Pt call back # is  [phone]

## 2015-08-04 NOTE — Telephone Encounter (Signed)
Left detailed message.Royal

## 2015-08-04 NOTE — Telephone Encounter (Signed)
She should take Ibuprofen, 200 mg., 2-3 up to 4 times a day for headaches.  Or can stop the Albuterol iof headaches are severe.-jh

## 2015-08-07 ENCOUNTER — Ambulatory Visit (INDEPENDENT_AMBULATORY_CARE_PROVIDER_SITE_OTHER): Payer: 59 | Admitting: Family Medicine

## 2015-08-07 ENCOUNTER — Encounter: Payer: Self-pay | Admitting: Family Medicine

## 2015-08-07 VITALS — BP 122/84 | HR 76 | Temp 98.2°F | Resp 16 | Ht 69.0 in | Wt 184.0 lb

## 2015-08-07 DIAGNOSIS — R05 Cough: Secondary | ICD-10-CM | POA: Diagnosis not present

## 2015-08-07 DIAGNOSIS — R059 Cough, unspecified: Secondary | ICD-10-CM

## 2015-08-07 MED ORDER — BENZONATATE 100 MG PO CAPS: 100.0000 mg | ORAL_CAPSULE | Freq: Three times a day (TID) | ORAL | Status: DC | PRN

## 2015-08-07 MED ORDER — PREDNISONE 10 MG PO TABS: ORAL_TABLET | ORAL | Status: DC

## 2015-08-07 NOTE — Patient Instructions (Signed)
Use Vicks Vaporub on feet at night to help cojugh.

## 2015-08-07 NOTE — Progress Notes (Signed)
Name: Rachel Wade   MRN: [id number]    DOB: Aug 01, 1956   Date:08/07/2015       Progress Note  Subjective  Chief Complaint  Chief Complaint  Patient presents with  . Bronchitis    recurrent    HPI Here because of continued cough.  Treated with Z-pak 6 days ago .  Finished 36 hrs ago. Tried to use Albuterol inhaler, but reported a headache with each use of inhaler.  Feels that she is feeling some better, but still with severe hacky cough.  Cough with minimal production. CXR done 08/01/15 was neg for acute cardiopulmonary disease.  No problem-specific assessment & plan notes found for this encounter.   Past Medical History  Diagnosis Date  . Medical history non-contributory   . Dysrhythmia 1977    when she was 20-? cardiac arrest-[facility]-dx low Potasium  . Seasonal allergies   . Arthritis     states Rheumatoid in hands  . Asthma   . Anemia   . Chronic headaches     Past Surgical History  Procedure Laterality Date  . Wisdom tooth extraction    . Colonoscopy    . Ovarian cyst surgery  2003  . Breast lumpectomy with radioactive seed localization Right 11/07/2014    Procedure: BREAST LUMPECTOMY WITH RADIOACTIVE SEED LOCALIZATION;  Surgeon: Autumn Messing III, MD;  Location: [facility];  Service: General;  Laterality: Right;    History reviewed. No pertinent family history.  Social History   Social History  . Marital Status: Married    Spouse Name: N/A  . Number of Children: N/A  . Years of Education: N/A   Occupational History  . Not on file.   Social History Main Topics  . Smoking status: Former Smoker    Quit date: 11/02/1975  . Smokeless tobacco: Not on file  . Alcohol Use: Yes     Comment: occ-rare  . Drug Use: No  . Sexual Activity: Not on file   Other Topics Concern  . Not on file   Social History Narrative     Current outpatient prescriptions:  .  Calcium Carb-Cholecalciferol (CALCIUM + D3 PO), Take by mouth., Disp: , Rfl:  .   cholecalciferol (VITAMIN D) 1000 UNITS tablet, Take 1,000 Units by mouth daily., Disp: , Rfl:  .  dextromethorphan-guaiFENesin (MUCINEX DM) 30-600 MG 12hr tablet, Take 1 tablet by mouth 2 (two) times daily., Disp: 20 tablet, Rfl: 1 .  Homeopathic Products (ZICAM COLD REMEDY NA), Place into the nose., Disp: , Rfl:  .  loratadine (CLARITIN) 10 MG tablet, Take 1 tablet (10 mg total) by mouth daily., Disp: 30 tablet, Rfl: 11 .  albuterol (PROVENTIL HFA;VENTOLIN HFA) 108 (90 BASE) MCG/ACT inhaler, Inhale 2 puffs into the lungs every 6 (six) hours as needed for wheezing or shortness of breath. (Patient not taking: Reported on 08/07/2015), Disp: 1 Inhaler, Rfl: 3 .  benzonatate (TESSALON) 100 MG capsule, Take 1 capsule (100 mg total) by mouth 3 (three) times daily as needed for cough., Disp: 30 capsule, Rfl: 0 .  predniSONE (DELTASONE) 10 MG tablet, Take 6 tab on day 1, then decrease by 1 every other day for 12 days (6, 6, 5, 5, 4, 4, 3, 3, 2, 2, 1, 1), Disp: 42 tablet, Rfl: 0  Allergies  Allergen Reactions  . Penicillins     unknown  . Erythromycin Nausea And Vomiting    All MYCINS make her sick     Review of Systems  Constitutional:  Negative for fever, chills, weight loss and malaise/fatigue.  HENT: Negative for hearing loss.   Eyes: Negative for blurred vision and double vision.  Respiratory: Positive for cough, sputum production, shortness of breath and wheezing.   Cardiovascular: Negative for chest pain, palpitations and leg swelling.       R chest wall pain with deep breathning  Gastrointestinal: Negative for heartburn, abdominal pain and blood in stool.  Genitourinary: Negative for dysuria, urgency and frequency.  Skin: Negative for rash.  Neurological: Negative for weakness and headaches.      Objective  Filed Vitals:   08/07/15 1555  BP: 122/84  Pulse: 76  Temp: 98.2 F (36.8 C)  TempSrc: Oral  Resp: 16  Height: 5\' 9"  (1.753 m)  Weight: 184 lb (83.462 kg)    Physical  Exam  Constitutional: She is oriented to person, place, and time and well-developed, well-nourished, and in no distress. No distress.  HENT:  Head: Normocephalic and atraumatic.  Nose: Nose normal.  Mouth/Throat: Oropharynx is clear and moist.  Cardiovascular: Normal rate, regular rhythm and normal heart sounds.  Exam reveals no gallop and no friction rub.   No murmur heard. Pulmonary/Chest: Effort normal. No respiratory distress. She has wheezes (faint end expiraory wheezes esp with some coujgh). She has no rales.  Musculoskeletal: She exhibits no edema.  Lymphadenopathy:    She has no cervical adenopathy.  Neurological: She is alert and oriented to person, place, and time.  Vitals reviewed.      No results found for this or any previous visit (from the past 2160 hour(s)).   Assessment & Plan  Problem List Items Addressed This Visit    None    Visit Diagnoses    Cough    -  Primary    Relevant Medications    predniSONE (DELTASONE) 10 MG tablet    benzonatate (TESSALON) 100 MG capsule       Meds ordered this encounter  Medications  . predniSONE (DELTASONE) 10 MG tablet    Sig: Take 6 tab on day 1, then decrease by 1 every other day for 12 days (6, 6, 5, 5, 4, 4, 3, 3, 2, 2, 1, 1)    Dispense:  42 tablet    Refill:  0  . benzonatate (TESSALON) 100 MG capsule    Sig: Take 1 capsule (100 mg total) by mouth 3 (three) times daily as needed for cough.    Dispense:  30 capsule    Refill:  0   1. Cough-post- bronchitic  - predniSONE (DELTASONE) 10 MG tablet; Take 6 tab on day 1, then decrease by 1 every other day for 12 days (6, 6, 5, 5, 4, 4, 3, 3, 2, 2, 1, 1)  Dispense: 42 tablet; Refill: 0 - benzonatate (TESSALON) 100 MG capsule; Take 1 capsule (100 mg total) by mouth 3 (three) times daily as needed for cough.  Dispense: 30 capsule; Refill: 0

## 2015-08-11 ENCOUNTER — Encounter: Payer: Self-pay | Admitting: Family Medicine

## 2015-08-11 ENCOUNTER — Ambulatory Visit
Admission: RE | Admit: 2015-08-11 | Discharge: 2015-08-11 | Disposition: A | Discharge status: Home / Self Care | Payer: 59 | Source: Ambulatory Visit | Attending: Family Medicine | Admitting: Family Medicine

## 2015-08-11 ENCOUNTER — Ambulatory Visit (INDEPENDENT_AMBULATORY_CARE_PROVIDER_SITE_OTHER): Payer: 59 | Admitting: Family Medicine

## 2015-08-11 VITALS — BP 137/79 | HR 85 | Temp 99.1°F | Resp 16 | Ht 69.0 in | Wt 187.0 lb

## 2015-08-11 DIAGNOSIS — R059 Cough, unspecified: Secondary | ICD-10-CM

## 2015-08-11 DIAGNOSIS — R05 Cough: Secondary | ICD-10-CM | POA: Insufficient documentation

## 2015-08-11 DIAGNOSIS — J40 Bronchitis, not specified as acute or chronic: Secondary | ICD-10-CM | POA: Diagnosis not present

## 2015-08-11 MED ORDER — DOXYCYCLINE HYCLATE 100 MG PO TABS: 100.0000 mg | ORAL_TABLET | Freq: Two times a day (BID) | ORAL | Status: DC

## 2015-08-11 NOTE — Patient Instructions (Signed)
Use OTC cough medication that she tolerates.
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
## 2015-08-24 ENCOUNTER — Ambulatory Visit (INDEPENDENT_AMBULATORY_CARE_PROVIDER_SITE_OTHER): Payer: 59 | Admitting: Family Medicine

## 2015-08-24 VITALS — BP 123/82 | HR 78 | Temp 100.1°F | Resp 16 | Ht 69.0 in | Wt 184.0 lb

## 2015-08-24 DIAGNOSIS — R509 Fever, unspecified: Secondary | ICD-10-CM | POA: Diagnosis not present

## 2015-08-24 DIAGNOSIS — K149 Disease of tongue, unspecified: Secondary | ICD-10-CM | POA: Diagnosis not present

## 2015-08-24 DIAGNOSIS — R1011 Right upper quadrant pain: Secondary | ICD-10-CM

## 2015-08-24 DIAGNOSIS — R05 Cough: Secondary | ICD-10-CM | POA: Diagnosis not present

## 2015-08-24 DIAGNOSIS — J329 Chronic sinusitis, unspecified: Secondary | ICD-10-CM

## 2015-08-24 DIAGNOSIS — R059 Cough, unspecified: Secondary | ICD-10-CM

## 2015-08-24 DIAGNOSIS — R591 Generalized enlarged lymph nodes: Secondary | ICD-10-CM

## 2015-08-24 LAB — POCT INFLUENZA A/B
INFLUENZA B, POC: NEGATIVE
Influenza A, POC: NEGATIVE

## 2015-08-24 MED ORDER — CEFUROXIME AXETIL 500 MG PO TABS: 500.0000 mg | ORAL_TABLET | Freq: Two times a day (BID) | ORAL | Status: AC

## 2015-08-24 MED ORDER — FLUTICASONE PROPIONATE HFA 110 MCG/ACT IN AERO: 1.0000 | INHALATION_SPRAY | Freq: Two times a day (BID) | RESPIRATORY_TRACT | Status: AC

## 2015-08-24 MED ORDER — FLUTICASONE PROPIONATE 50 MCG/ACT NA SUSP: 2.0000 | Freq: Every day | NASAL | Status: AC

## 2015-08-24 MED ORDER — MAGIC MOUTHWASH W/LIDOCAINE: 5.0000 mL | Freq: Three times a day (TID) | ORAL | Status: AC | PRN

## 2015-08-24 NOTE — Assessment & Plan Note (Addendum)
Given that it hasn't improved with prednisone x2.  Likely post-viral cough or upper airway cough syndrome.   Flovent for chest tightness. Mucinex at home.  If not improving, consider pulmonology referral.

## 2015-08-24 NOTE — Assessment & Plan Note (Signed)
Given pt symptoms of nasal congestion- likely sinus related. Trial of 3rd generation cephlasporin. Consider ENT referral if not improving.  Flonase nasal spray.
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
## 2015-08-24 NOTE — Patient Instructions (Addendum)
I think your sinuses are the source of your symptoms. We will try another round of antibiotics. We will also check some labs to ensure nothing else is going on.   Take cefuroxime twice daily for 10 days.  Magic mouthwash PRN three times daily for your tongue.   Take Flovent 2 puffs daily.  Try flonase  To help with symptoms.  2 sprays daily.

## 2015-08-25 ENCOUNTER — Telehealth: Payer: Self-pay | Admitting: Family Medicine

## 2015-08-25 LAB — CBC WITH DIFFERENTIAL/PLATELET
BASOS ABS: 0 10*3/uL (ref 0.0–0.2)
Basos: 0 %
EOS (ABSOLUTE): 0.1 10*3/uL (ref 0.0–0.4)
Eos: 2 %
HEMOGLOBIN: 13.9 g/dL (ref 11.1–15.9)
Hematocrit: 40.6 % (ref 34.0–46.6)
Immature Grans (Abs): 0 10*3/uL (ref 0.0–0.1)
Immature Granulocytes: 0 %
LYMPHS ABS: 3.3 10*3/uL — AB (ref 0.7–3.1)
Lymphs: 38 %
MCH: 30.5 pg (ref 26.6–33.0)
MCHC: 34.2 g/dL (ref 31.5–35.7)
MCV: 89 fL (ref 79–97)
MONOCYTES: 6 %
Monocytes Absolute: 0.5 10*3/uL (ref 0.1–0.9)
Neutrophils Absolute: 4.8 10*3/uL (ref 1.4–7.0)
Neutrophils: 54 %
PLATELETS: 277 10*3/uL (ref 150–379)
RBC: 4.55 x10E6/uL (ref 3.77–5.28)
RDW: 13 % (ref 12.3–15.4)
WBC: 8.9 10*3/uL (ref 3.4–10.8)

## 2015-08-25 LAB — COMPREHENSIVE METABOLIC PANEL
A/G RATIO: 1.9 (ref 1.1–2.5)
ALT: 17 IU/L (ref 0–32)
AST: 15 IU/L (ref 0–40)
Albumin: 3.9 g/dL (ref 3.6–4.8)
Alkaline Phosphatase: 65 IU/L (ref 39–117)
BUN/Creatinine Ratio: 14 (ref 11–26)
BUN: 11 mg/dL (ref 8–27)
Bilirubin Total: 0.4 mg/dL (ref 0.0–1.2)
CHLORIDE: 101 mmol/L (ref 96–106)
CO2: 24 mmol/L (ref 18–29)
Calcium: 9.3 mg/dL (ref 8.7–10.3)
Creatinine, Ser: 0.76 mg/dL (ref 0.57–1.00)
GFR calc Af Amer: 99 mL/min/{1.73_m2} (ref >59)
GFR calc non Af Amer: 86 mL/min/{1.73_m2} (ref >59)
GLOBULIN, TOTAL: 2.1 g/dL (ref 1.5–4.5)
Glucose: 103 mg/dL — ABNORMAL HIGH (ref 65–99)
POTASSIUM: 4.5 mmol/L (ref 3.5–5.2)
SODIUM: 140 mmol/L (ref 134–144)
Total Protein: 6 g/dL (ref 6.0–8.5)

## 2015-08-25 LAB — AMYLASE: Amylase: 82 U/L (ref 31–124)

## 2015-08-25 LAB — LIPASE: LIPASE: 35 U/L (ref 0–59)

## 2015-08-25 LAB — MONONUCLEOSIS SCREEN: MONO SCREEN: NEGATIVE

## 2015-08-25 NOTE — Telephone Encounter (Addendum)
Called pt to review labs: LMTCB.   Reviewed labs with patient.  She does not want ultrasound anymore. She is getting better on antibiotic but having side effects- she has muscle cramps. If symptoms do not resolve we will have her see an ENT. If she cannot tolerate the muscle cramps please let me know.

## 2015-08-29 ENCOUNTER — Telehealth: Payer: Self-pay | Admitting: Family Medicine

## 2015-08-29 ENCOUNTER — Ambulatory Visit: Payer: 59

## 2015-08-29 NOTE — Telephone Encounter (Signed)
Called and LMTCB regarding FMLA paperwork. I need contact information regarding how to return FMLA paperwork.

## 2016-02-20 NOTE — Telephone Encounter (Signed)
Error

## 2016-11-27 ENCOUNTER — Emergency Department (HOSPITAL_COMMUNITY)
Admission: EM | Admit: 2016-11-27 | Discharge: 2016-11-27 | Disposition: A | Discharge status: Home / Self Care | Payer: 59 | Attending: Emergency Medicine | Admitting: Emergency Medicine

## 2016-11-27 ENCOUNTER — Encounter (HOSPITAL_COMMUNITY): Payer: Self-pay

## 2016-11-27 ENCOUNTER — Emergency Department (HOSPITAL_COMMUNITY): Payer: 59

## 2016-11-27 DIAGNOSIS — M62838 Other muscle spasm: Secondary | ICD-10-CM | POA: Diagnosis not present

## 2016-11-27 DIAGNOSIS — Z79899 Other long term (current) drug therapy: Secondary | ICD-10-CM | POA: Diagnosis not present

## 2016-11-27 DIAGNOSIS — Z87891 Personal history of nicotine dependence: Secondary | ICD-10-CM | POA: Diagnosis not present

## 2016-11-27 DIAGNOSIS — Y92410 Unspecified street and highway as the place of occurrence of the external cause: Secondary | ICD-10-CM | POA: Diagnosis not present

## 2016-11-27 DIAGNOSIS — R51 Headache: Secondary | ICD-10-CM | POA: Insufficient documentation

## 2016-11-27 DIAGNOSIS — R079 Chest pain, unspecified: Secondary | ICD-10-CM

## 2016-11-27 DIAGNOSIS — Y939 Activity, unspecified: Secondary | ICD-10-CM | POA: Insufficient documentation

## 2016-11-27 DIAGNOSIS — J45909 Unspecified asthma, uncomplicated: Secondary | ICD-10-CM | POA: Insufficient documentation

## 2016-11-27 DIAGNOSIS — R519 Headache, unspecified: Secondary | ICD-10-CM

## 2016-11-27 DIAGNOSIS — T148XXA Other injury of unspecified body region, initial encounter: Secondary | ICD-10-CM

## 2016-11-27 DIAGNOSIS — Y999 Unspecified external cause status: Secondary | ICD-10-CM | POA: Diagnosis not present

## 2016-11-27 MED ORDER — CYCLOBENZAPRINE HCL 10 MG PO TABS: 10.0000 mg | ORAL_TABLET | Freq: Every day | ORAL | 0 refills | Status: AC

## 2016-11-27 MED ORDER — NAPROXEN 375 MG PO TABS: 375.0000 mg | ORAL_TABLET | Freq: Two times a day (BID) | ORAL | 0 refills | Status: AC
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
## 2016-11-27 NOTE — ED Notes (Signed)
Patient given ginger ale to drink 

## 2016-11-27 NOTE — ED Notes (Signed)
EKG being re-done per EDP.

## 2016-11-27 NOTE — ED Notes (Signed)
Denies LOC.  Did not hit head.

## 2016-11-27 NOTE — ED Notes (Signed)
On way to XR 

## 2017-09-16 DIAGNOSIS — R05 Cough: Secondary | ICD-10-CM | POA: Diagnosis not present

## 2017-09-16 DIAGNOSIS — J101 Influenza due to other identified influenza virus with other respiratory manifestations: Secondary | ICD-10-CM | POA: Diagnosis not present

## 2017-09-20 DIAGNOSIS — R05 Cough: Secondary | ICD-10-CM | POA: Diagnosis not present

## 2017-09-20 DIAGNOSIS — J069 Acute upper respiratory infection, unspecified: Secondary | ICD-10-CM | POA: Diagnosis not present

## 2018-01-27 DIAGNOSIS — M546 Pain in thoracic spine: Secondary | ICD-10-CM | POA: Diagnosis not present

## 2018-01-27 DIAGNOSIS — M25512 Pain in left shoulder: Secondary | ICD-10-CM | POA: Diagnosis not present

## 2018-01-27 DIAGNOSIS — M9902 Segmental and somatic dysfunction of thoracic region: Secondary | ICD-10-CM | POA: Diagnosis not present

## 2018-01-28 DIAGNOSIS — M25512 Pain in left shoulder: Secondary | ICD-10-CM | POA: Diagnosis not present

## 2018-01-28 DIAGNOSIS — M546 Pain in thoracic spine: Secondary | ICD-10-CM | POA: Diagnosis not present

## 2018-01-28 DIAGNOSIS — M9902 Segmental and somatic dysfunction of thoracic region: Secondary | ICD-10-CM | POA: Diagnosis not present

## 2018-01-29 DIAGNOSIS — M546 Pain in thoracic spine: Secondary | ICD-10-CM | POA: Diagnosis not present

## 2018-01-29 DIAGNOSIS — M25512 Pain in left shoulder: Secondary | ICD-10-CM | POA: Diagnosis not present

## 2018-01-29 DIAGNOSIS — M9902 Segmental and somatic dysfunction of thoracic region: Secondary | ICD-10-CM | POA: Diagnosis not present

## 2018-02-09 DIAGNOSIS — M546 Pain in thoracic spine: Secondary | ICD-10-CM | POA: Diagnosis not present

## 2018-02-09 DIAGNOSIS — M9902 Segmental and somatic dysfunction of thoracic region: Secondary | ICD-10-CM | POA: Diagnosis not present

## 2018-02-09 DIAGNOSIS — M25512 Pain in left shoulder: Secondary | ICD-10-CM | POA: Diagnosis not present

## 2018-02-17 DIAGNOSIS — M9902 Segmental and somatic dysfunction of thoracic region: Secondary | ICD-10-CM | POA: Diagnosis not present

## 2018-02-17 DIAGNOSIS — M25512 Pain in left shoulder: Secondary | ICD-10-CM | POA: Diagnosis not present

## 2018-02-17 DIAGNOSIS — M546 Pain in thoracic spine: Secondary | ICD-10-CM | POA: Diagnosis not present

## 2018-03-02 DIAGNOSIS — M9902 Segmental and somatic dysfunction of thoracic region: Secondary | ICD-10-CM | POA: Diagnosis not present

## 2018-03-02 DIAGNOSIS — M546 Pain in thoracic spine: Secondary | ICD-10-CM | POA: Diagnosis not present

## 2018-03-02 DIAGNOSIS — M25512 Pain in left shoulder: Secondary | ICD-10-CM | POA: Diagnosis not present

## 2018-03-12 DIAGNOSIS — M546 Pain in thoracic spine: Secondary | ICD-10-CM | POA: Diagnosis not present

## 2018-03-12 DIAGNOSIS — M25512 Pain in left shoulder: Secondary | ICD-10-CM | POA: Diagnosis not present

## 2018-03-12 DIAGNOSIS — M9902 Segmental and somatic dysfunction of thoracic region: Secondary | ICD-10-CM | POA: Diagnosis not present

## 2018-05-03 ENCOUNTER — Encounter: Payer: Self-pay | Admitting: Medical Oncology

## 2018-05-03 ENCOUNTER — Emergency Department
Admission: EM | Admit: 2018-05-03 | Discharge: 2018-05-03 | Disposition: A | Discharge status: Home / Self Care | Payer: 59 | Attending: Emergency Medicine | Admitting: Emergency Medicine

## 2018-05-03 DIAGNOSIS — Z79899 Other long term (current) drug therapy: Secondary | ICD-10-CM | POA: Insufficient documentation

## 2018-05-03 DIAGNOSIS — Y929 Unspecified place or not applicable: Secondary | ICD-10-CM | POA: Diagnosis not present

## 2018-05-03 DIAGNOSIS — Y998 Other external cause status: Secondary | ICD-10-CM | POA: Insufficient documentation

## 2018-05-03 DIAGNOSIS — Z23 Encounter for immunization: Secondary | ICD-10-CM | POA: Insufficient documentation

## 2018-05-03 DIAGNOSIS — J45909 Unspecified asthma, uncomplicated: Secondary | ICD-10-CM | POA: Diagnosis not present

## 2018-05-03 DIAGNOSIS — Z87891 Personal history of nicotine dependence: Secondary | ICD-10-CM | POA: Insufficient documentation

## 2018-05-03 DIAGNOSIS — S61411A Laceration without foreign body of right hand, initial encounter: Secondary | ICD-10-CM | POA: Diagnosis not present

## 2018-05-03 DIAGNOSIS — W5582XA Struck by other mammals, initial encounter: Secondary | ICD-10-CM | POA: Diagnosis not present

## 2018-05-03 DIAGNOSIS — Y9389 Activity, other specified: Secondary | ICD-10-CM | POA: Insufficient documentation

## 2018-05-03 MED ORDER — TETANUS-DIPHTH-ACELL PERTUSSIS 5-2.5-18.5 LF-MCG/0.5 IM SUSP
0.5000 mL | Freq: Once | INTRAMUSCULAR | Status: AC
  Administered 2018-05-03: 0.5 mL via INTRAMUSCULAR
  Filled 2018-05-03: qty 0.5

## 2018-05-03 MED ORDER — LIDOCAINE HCL (PF) 1 % IJ SOLN
5.0000 mL | Freq: Once | INTRAMUSCULAR | Status: AC
  Administered 2018-05-03: 5 mL
  Filled 2018-05-03: qty 5

## 2018-05-03 NOTE — ED Provider Notes (Signed)
[facility] Emergency Department Provider Note ____________________________________________  Time seen: 1408  I have reviewed the triage vital signs and the nursing notes.  HISTORY  Chief Complaint  Laceration  HPI Rachel Wade is a 62 y.o. female resents to the ED company by her husband, for evaluation of a laceration to the palm.  Patient describes she was cut by the horn of 1 of her small goats, just prior to arrival.  She reports having a attacks glove on the hand during the incident.  She presents now with a laceration over the hyperthenar prominence.  No other injuries reported.  She reports her tetanus is likely out of date.  Past Medical History:  Diagnosis Date  . Anemia   . Arthritis    states Rheumatoid in hands  . Asthma   . Chronic headaches   . Dysrhythmia 1977   when she was 20-? cardiac arrest-[facility] low [place]  . Medical history non-contributory   . Seasonal allergies     Patient Active Problem List   Diagnosis Date Noted  . Recurrent sinusitis 08/24/2015  . Bronchitis 08/11/2015  . Cough 08/07/2015    Past Surgical History:  Procedure Laterality Date  . BREAST LUMPECTOMY WITH RADIOACTIVE SEED LOCALIZATION Right 11/07/2014   Procedure: BREAST LUMPECTOMY WITH RADIOACTIVE SEED LOCALIZATION;  Surgeon: Autumn Messing III, MD;  Location: [place];  Service: General;  Laterality: Right;  . COLONOSCOPY    . OVARIAN CYST SURGERY  2003  . WISDOM TOOTH EXTRACTION      Prior to Admission medications   Medication Sig Start Date End Date Taking? Authorizing Provider  albuterol (PROVENTIL HFA;VENTOLIN HFA) 108 (90 BASE) MCG/ACT inhaler Inhale 2 puffs into the lungs every 6 (six) hours as needed for wheezing or shortness of breath. 08/01/15   Arlis Porta., MD  Calcium Carb-Cholecalciferol (CALCIUM + D3 PO) Take by mouth.    [provider]  cefUROXime (CEFTIN) 500 MG tablet Take 1 tablet (500 mg total) by mouth 2  (two) times daily with a meal. 08/24/15   Krebs, Genevie Cheshire, NP  cholecalciferol (VITAMIN D) 1000 UNITS tablet Take 1,000 Units by mouth daily.    [provider]  fluticasone (FLONASE) 50 MCG/ACT nasal spray Place 2 sprays into both nostrils daily. 08/24/15   Krebs, Genevie Cheshire, NP  fluticasone (FLOVENT HFA) 110 MCG/ACT inhaler Inhale 1 puff into the lungs 2 (two) times daily. 08/24/15   Luciana Axe, NP  Homeopathic Products [facility] COLD REMEDY NA) Place into the nose.    [provider]  loratadine (CLARITIN) 10 MG tablet Take 1 tablet (10 mg total) by mouth daily. 08/01/15   Arlis Porta., MD  magic mouthwash w/lidocaine SOLN Take 5 mLs by mouth 3 (three) times daily as needed for mouth pain. 08/24/15   Luciana Axe, NP  naproxen (NAPROSYN) 375 MG tablet Take 1 tablet (375 mg total) by mouth 2 (two) times daily. 11/27/16   Fatima Blank, MD    Allergies Penicillins; Codeine; Erythromycin; and Levaquin [levofloxacin]  No family history on file.  Social History Social History   Tobacco Use  . Smoking status: Former Smoker    Last attempt to quit: 11/02/1975    Years since quitting: 42.5  Substance Use Topics  . Alcohol use: Yes    Comment: occ-rare  . Drug use: No    Review of Systems  Constitutional: Negative for fever. Cardiovascular: Negative for chest pain. Respiratory: Negative for  shortness of breath. Musculoskeletal: Negative for back pain. Skin: Negative for rash.  Right palm laceration as above. Neurological: Negative for headaches, focal weakness or numbness. ____________________________________________  PHYSICAL EXAM:  VITAL SIGNS: ED Triage Vitals [05/03/18 1352]  Enc Vitals Group     BP 135/73     Pulse Rate 77     Resp 16     Temp      Temp src      SpO2 98 %     Weight 160 lb 15 oz (73 kg)     Height      Head Circumference      Peak Flow      Pain Score 2     Pain Loc      Pain Edu?      Excl. in [place]?      Constitutional: Alert and oriented. Well appearing and in no distress. Head: Normocephalic and atraumatic. Eyes: Conjunctivae are normal. Normal extraocular movements Cardiovascular: Normal rate, regular rhythm. Normal distal pulses. Respiratory: Normal respiratory effort. No wheezes/rales/rhonchi. Musculoskeletal: Normal composite fist on the right.  Patient with linear laceration traversing the hyperthenar prominence superficially.  Some cutaneous fat is visible in the wound.  Nontender with normal range of motion in all extremities.  Neurologic:  Normal gross sensation.  Normal speech and language. No gross focal neurologic deficits are appreciated. Skin:  Skin is warm, dry and intact. No rash noted. ____________________________________________  PROCEDURES Tdap 0.5 ml IM  .Marland KitchenLaceration Repair Date/Time: 05/03/2018 6:16 PM Performed by: Melvenia Needles, PA-C Authorized by: Melvenia Needles, PA-C   Consent:    Consent obtained:  Verbal   Consent given by:  Patient   Risks discussed:  Poor wound healing and infection Anesthesia (see MAR for exact dosages):    Anesthesia method:  Local infiltration   Local anesthetic:  Lidocaine 1% w/o epi Laceration details:    Location:  Hand   Hand location:  R palm   Length (cm):  3 Repair type:    Repair type:  Simple Pre-procedure details:    Preparation:  Patient was prepped and draped in usual sterile fashion Exploration:    Contaminated: no   Treatment:    Area cleansed with:  Betadine   Amount of cleaning:  Standard   Irrigation solution:  Sterile saline   Irrigation method:  Syringe Skin repair:    Repair method:  Sutures   Suture size:  4-0   Suture material:  Nylon   Suture technique:  Simple interrupted   Number of sutures:  4 Approximation:    Approximation:  Close Post-procedure details:    Dressing:  Non-adherent dressing   Patient tolerance of procedure:  Tolerated well, no immediate  complications  ___________________________________________  INITIAL IMPRESSION / ASSESSMENT AND PLAN / ED COURSE  Patient with ED evaluation and management of an accidental laceration to the right palm.  The superficial wound caused by contact with a "torn, was repaired using sutures.  Patient is discharged with wound care instructions patient see her primary provider in 10 to 12 days for suture removal. ____________________________________________  FINAL CLINICAL IMPRESSION(S) / ED DIAGNOSES  Final diagnoses:  Laceration of right hand without foreign body, initial encounter      Melvenia Needles, PA-C 05/03/18 1818    Schuyler Amor, MD 05/03/18 2003

## 2018-05-03 NOTE — ED Notes (Signed)
Pt verbalizes d/c understanding and follow up. Pt in NAD, VSS, ambualtory. Pt unable to sign due to signature pad topaz.

## 2018-05-03 NOTE — Discharge Instructions (Addendum)
Keep the wound clean, dry, and covered. Wash with soap & water as needed. See your provider in 10-12 days for suture removal.

## 2018-05-03 NOTE — ED Notes (Signed)
See triage note   Presents with laceration to right hand  States she was hit by a goat horn

## 2018-05-03 NOTE — ED Triage Notes (Signed)
Pt has laceration to rt hand from goat horn. Tetanus is NOT UTD.

## 2018-05-14 DIAGNOSIS — S61411D Laceration without foreign body of right hand, subsequent encounter: Secondary | ICD-10-CM | POA: Diagnosis not present

## 2018-05-14 DIAGNOSIS — Z4802 Encounter for removal of sutures: Secondary | ICD-10-CM | POA: Diagnosis not present

## 2018-08-03 DIAGNOSIS — J0140 Acute pansinusitis, unspecified: Secondary | ICD-10-CM | POA: Diagnosis not present

## 2018-09-17 DIAGNOSIS — M546 Pain in thoracic spine: Secondary | ICD-10-CM | POA: Diagnosis not present

## 2018-09-17 DIAGNOSIS — M9901 Segmental and somatic dysfunction of cervical region: Secondary | ICD-10-CM | POA: Diagnosis not present

## 2018-09-17 DIAGNOSIS — M542 Cervicalgia: Secondary | ICD-10-CM | POA: Diagnosis not present

## 2018-09-21 DIAGNOSIS — M0000 Staphylococcal arthritis, unspecified joint: Secondary | ICD-10-CM | POA: Diagnosis not present

## 2018-09-21 DIAGNOSIS — M546 Pain in thoracic spine: Secondary | ICD-10-CM | POA: Diagnosis not present

## 2018-09-21 DIAGNOSIS — M9901 Segmental and somatic dysfunction of cervical region: Secondary | ICD-10-CM | POA: Diagnosis not present

## 2019-01-21 ENCOUNTER — Other Ambulatory Visit: Payer: Self-pay | Admitting: Nurse Practitioner

## 2019-01-21 ENCOUNTER — Ambulatory Visit
Admission: RE | Admit: 2019-01-21 | Discharge: 2019-01-21 | Disposition: A | Discharge status: Home / Self Care | Payer: No Typology Code available for payment source | Source: Ambulatory Visit | Attending: Nurse Practitioner | Admitting: Nurse Practitioner

## 2019-01-21 ENCOUNTER — Other Ambulatory Visit: Payer: Self-pay

## 2019-01-21 DIAGNOSIS — T07XXXA Unspecified multiple injuries, initial encounter: Secondary | ICD-10-CM

## 2019-01-21 DIAGNOSIS — R079 Chest pain, unspecified: Secondary | ICD-10-CM

## 2019-01-21 DIAGNOSIS — R0602 Shortness of breath: Secondary | ICD-10-CM

## 2019-05-26 ENCOUNTER — Other Ambulatory Visit: Payer: Self-pay | Admitting: General Surgery

## 2019-05-26 DIAGNOSIS — Z1231 Encounter for screening mammogram for malignant neoplasm of breast: Secondary | ICD-10-CM

## 2019-07-09 ENCOUNTER — Ambulatory Visit: Payer: Self-pay

## 2019-08-13 ENCOUNTER — Ambulatory Visit: Payer: Self-pay

## 2019-10-01 ENCOUNTER — Ambulatory Visit
Admission: RE | Admit: 2019-10-01 | Discharge: 2019-10-01 | Disposition: A | Discharge status: Home / Self Care | Payer: 59 | Source: Ambulatory Visit | Attending: General Surgery | Admitting: General Surgery

## 2019-10-01 ENCOUNTER — Other Ambulatory Visit: Payer: Self-pay

## 2019-10-01 DIAGNOSIS — Z1231 Encounter for screening mammogram for malignant neoplasm of breast: Secondary | ICD-10-CM

## 2020-12-25 ENCOUNTER — Other Ambulatory Visit: Payer: Self-pay | Admitting: General Surgery

## 2020-12-25 DIAGNOSIS — Z1231 Encounter for screening mammogram for malignant neoplasm of breast: Secondary | ICD-10-CM

## 2021-02-23 ENCOUNTER — Ambulatory Visit
Admission: RE | Admit: 2021-02-23 | Discharge: 2021-02-23 | Disposition: A | Discharge status: Home / Self Care | Payer: 59 | Source: Ambulatory Visit | Attending: General Surgery | Admitting: General Surgery

## 2021-02-23 ENCOUNTER — Other Ambulatory Visit: Payer: Self-pay

## 2021-02-23 DIAGNOSIS — Z1231 Encounter for screening mammogram for malignant neoplasm of breast: Secondary | ICD-10-CM

## 2021-03-01 ENCOUNTER — Other Ambulatory Visit: Payer: Self-pay | Admitting: General Surgery

## 2021-03-01 DIAGNOSIS — R928 Other abnormal and inconclusive findings on diagnostic imaging of breast: Secondary | ICD-10-CM

## 2021-03-06 ENCOUNTER — Other Ambulatory Visit: Payer: Self-pay | Admitting: Family Medicine

## 2021-03-06 ENCOUNTER — Other Ambulatory Visit: Payer: Self-pay | Admitting: Nurse Practitioner

## 2021-03-06 DIAGNOSIS — R928 Other abnormal and inconclusive findings on diagnostic imaging of breast: Secondary | ICD-10-CM

## 2021-03-07 ENCOUNTER — Other Ambulatory Visit: Payer: Self-pay

## 2021-03-07 ENCOUNTER — Ambulatory Visit
Admission: RE | Admit: 2021-03-07 | Discharge: 2021-03-07 | Disposition: A | Discharge status: Home / Self Care | Payer: Medicare HMO | Source: Ambulatory Visit | Attending: General Surgery | Admitting: General Surgery

## 2021-03-07 DIAGNOSIS — R928 Other abnormal and inconclusive findings on diagnostic imaging of breast: Secondary | ICD-10-CM

## 2021-03-07 DIAGNOSIS — N6002 Solitary cyst of left breast: Secondary | ICD-10-CM | POA: Diagnosis not present

## 2021-06-27 DIAGNOSIS — Z0000 Encounter for general adult medical examination without abnormal findings: Secondary | ICD-10-CM | POA: Diagnosis not present

## 2021-06-27 DIAGNOSIS — Z1211 Encounter for screening for malignant neoplasm of colon: Secondary | ICD-10-CM | POA: Diagnosis not present

## 2021-06-27 DIAGNOSIS — R7309 Other abnormal glucose: Secondary | ICD-10-CM | POA: Diagnosis not present

## 2021-06-27 DIAGNOSIS — E7800 Pure hypercholesterolemia, unspecified: Secondary | ICD-10-CM | POA: Diagnosis not present

## 2022-02-27 ENCOUNTER — Other Ambulatory Visit: Payer: Self-pay | Admitting: Family Medicine

## 2022-02-27 DIAGNOSIS — Z1231 Encounter for screening mammogram for malignant neoplasm of breast: Secondary | ICD-10-CM

## 2022-03-07 ENCOUNTER — Ambulatory Visit
Admission: RE | Admit: 2022-03-07 | Discharge: 2022-03-07 | Disposition: A | Discharge status: Home / Self Care | Payer: Medicare HMO | Source: Ambulatory Visit | Attending: Family Medicine | Admitting: Family Medicine

## 2022-03-07 DIAGNOSIS — Z1231 Encounter for screening mammogram for malignant neoplasm of breast: Secondary | ICD-10-CM

## 2022-04-15 IMAGING — MG MM DIGITAL SCREENING BILAT W/ TOMO AND CAD
8 series · 9 of 24 positions shown · non-contrast
Comparison: Previous exam(s).

CLINICAL DATA: Screening.

EXAM:
DIGITAL SCREENING BILATERAL MAMMOGRAM WITH TOMOSYNTHESIS AND CAD
TECHNIQUE: Bilateral screening digital craniocaudal and mediolateral oblique
mammograms were obtained. Bilateral screening digital breast
tomosynthesis was performed. The images were evaluated with
computer-aided detection.

[L CC synth-2D]
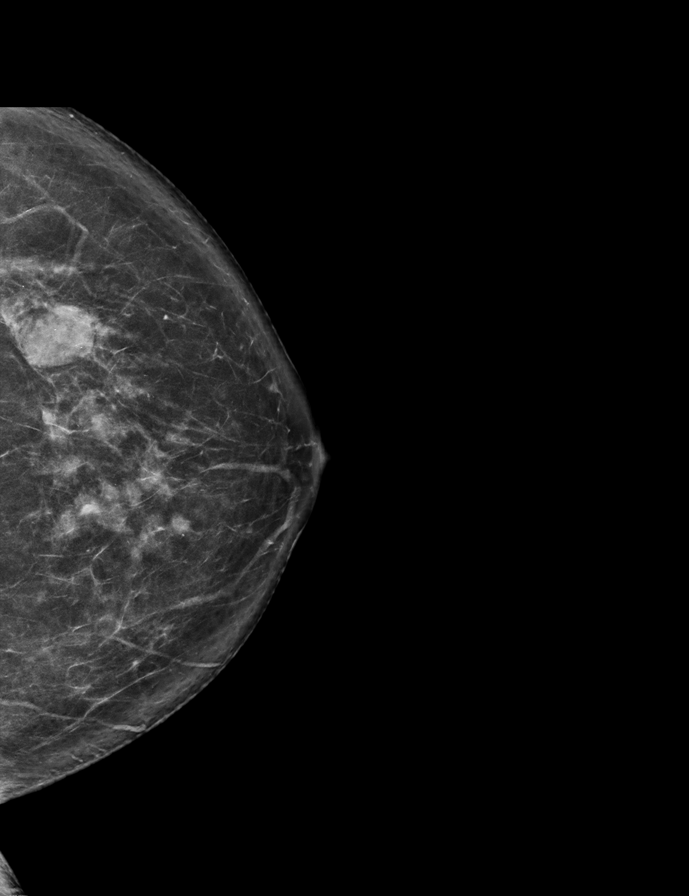

[R MLO synth-2D]
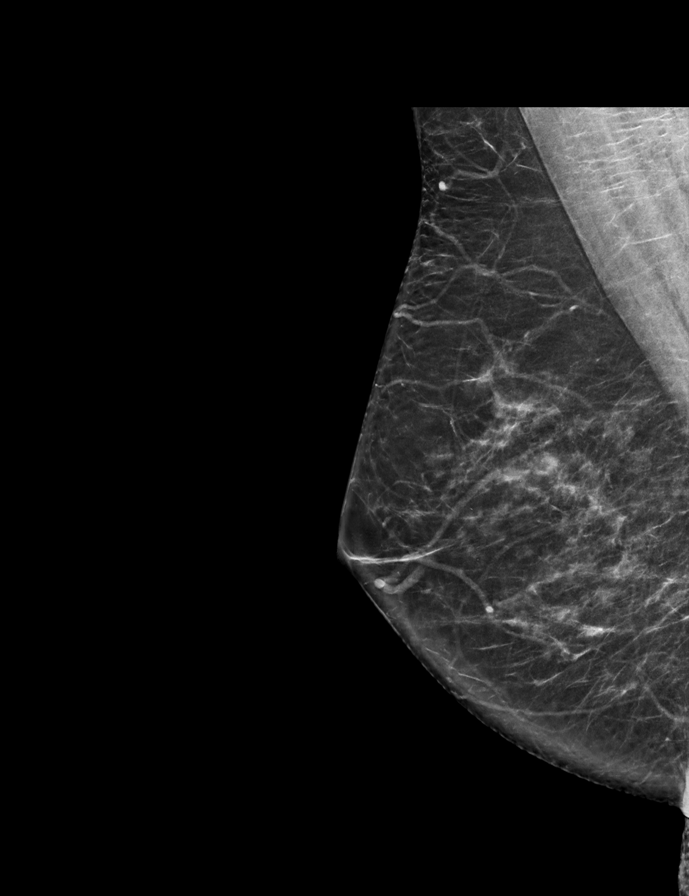

[L MLO synth-2D]
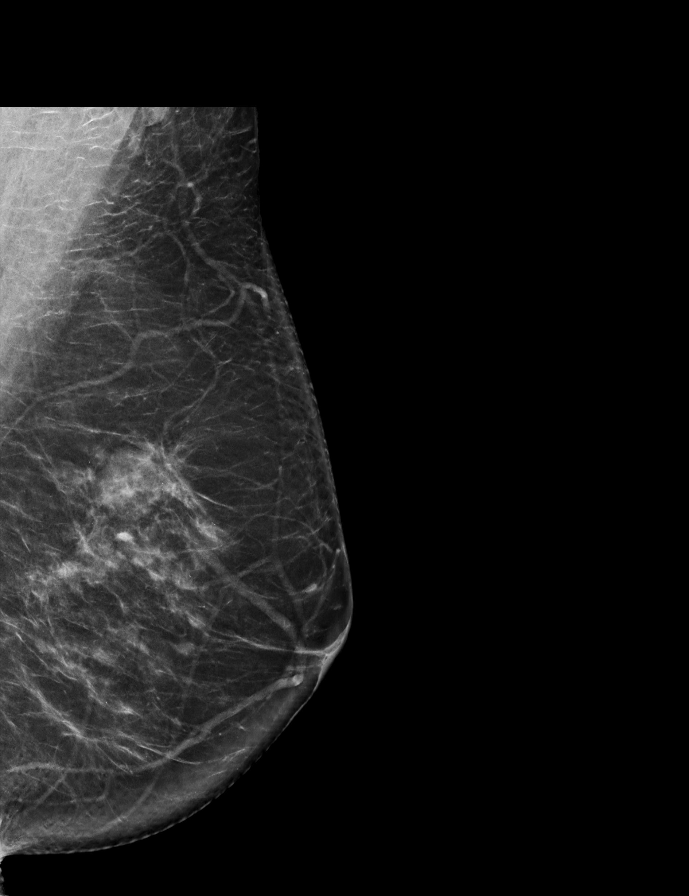

[R CC synth-2D]
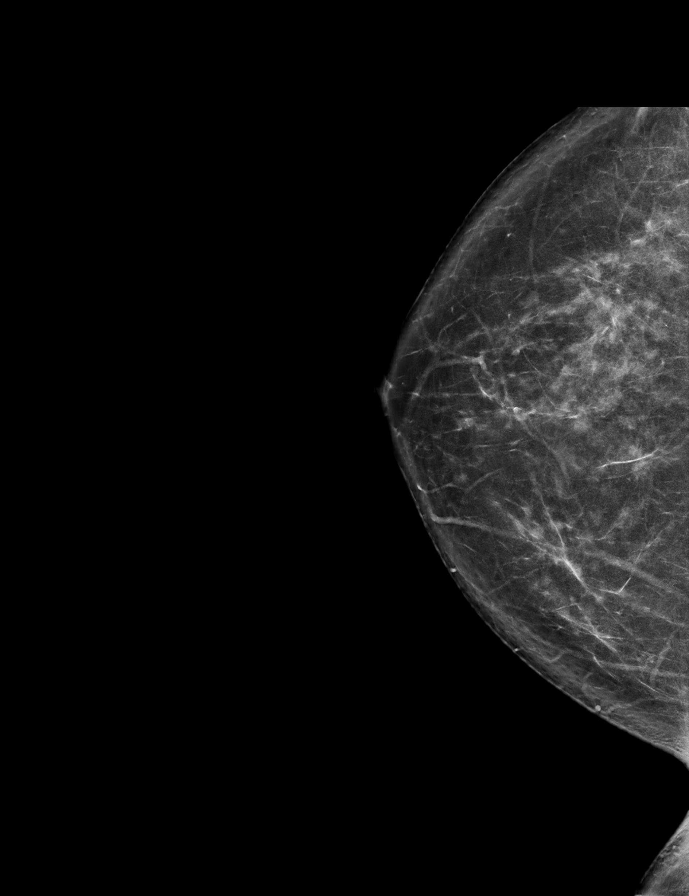

[L CC tomo · 2 of 74 frames shown]
[frame 24/74]
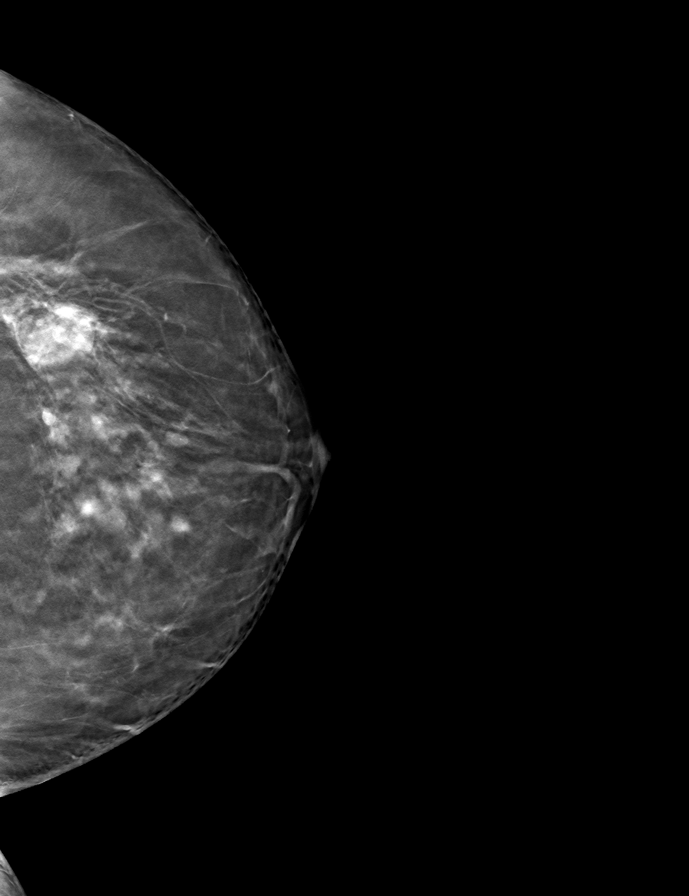
[frame 37/74]
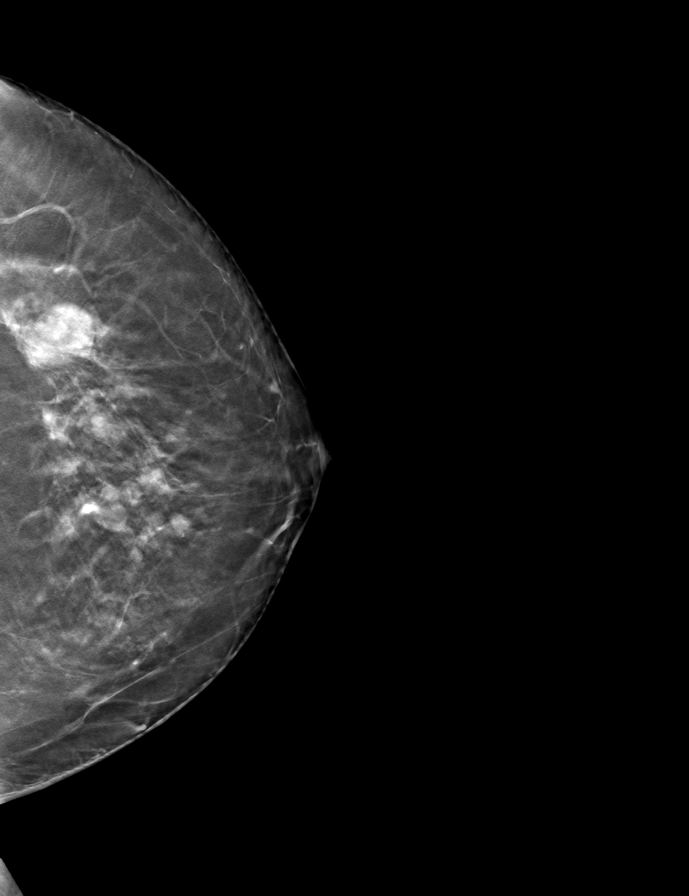

[R CC tomo · tomo slice 37/73.0]
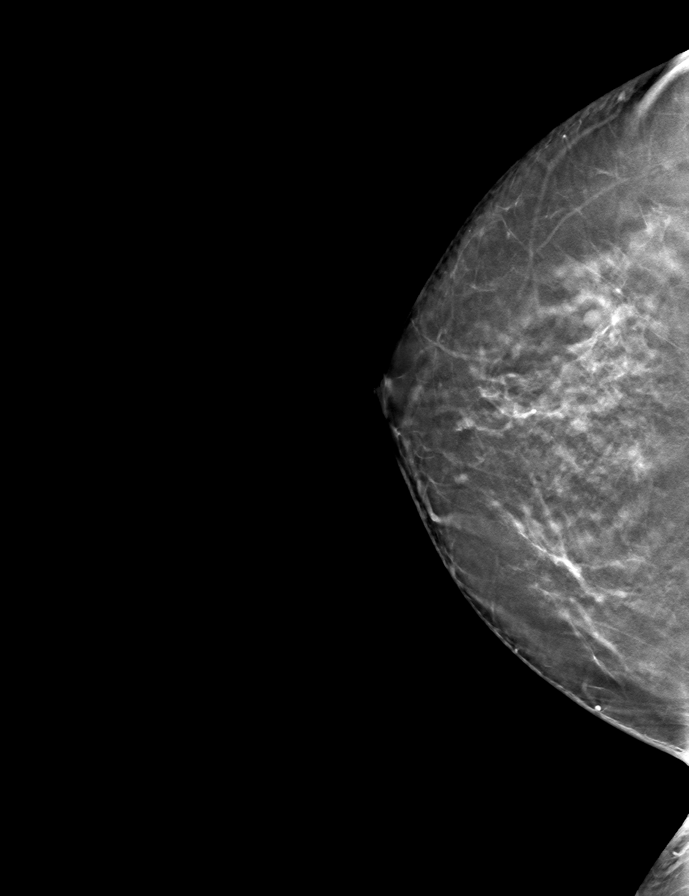

[R MLO tomo · tomo slice 37/73.0]
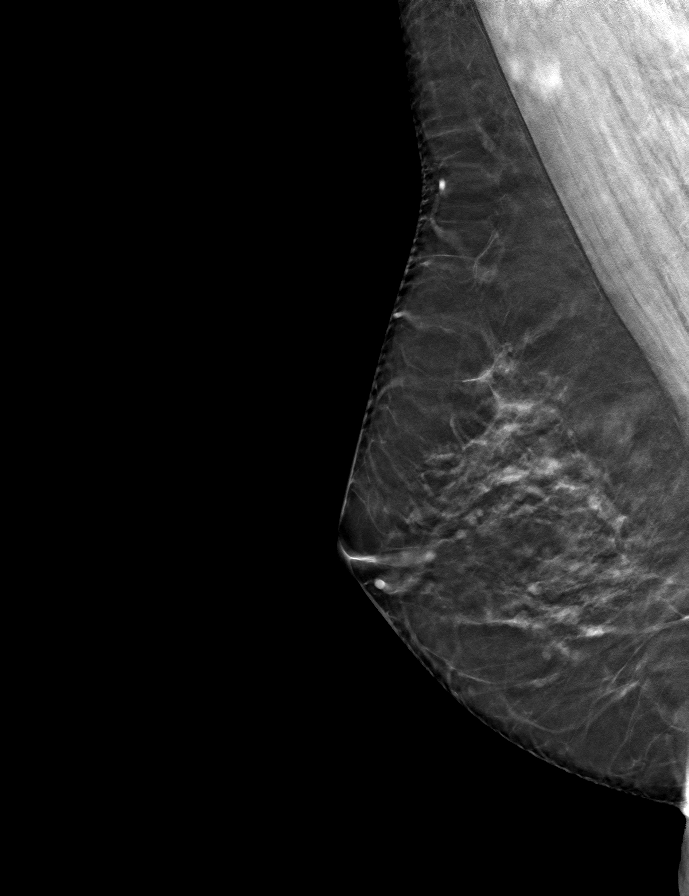

[L MLO tomo · tomo slice 41/80.0]
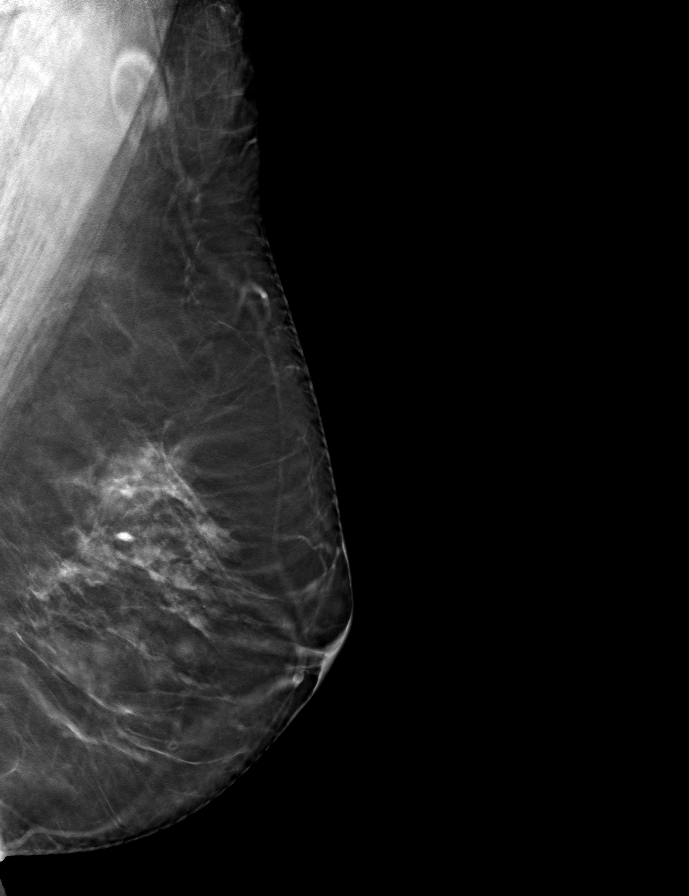

[9 of 24 positions shown; findings below may reference images not displayed]

ACR Breast Density Category b: There are scattered areas of
fibroglandular density.
FINDINGS: In the left breast, a possible mass warrants further evaluation. In
the right breast, no findings suspicious for malignancy.
IMPRESSION: Further evaluation is suggested for possible mass in the left
breast.

RECOMMENDATION:
Ultrasound of the left breast. (Code:B5-G-DD6)

The patient will be contacted regarding the findings, and additional
imaging will be scheduled.

BI-RADS CATEGORY  0: Incomplete. Need additional imaging evaluation
and/or prior mammograms for comparison.

## 2022-04-27 IMAGING — US US BREAST*L* LIMITED INC AXILLA
1 series · 5 of 5 positions shown · non-contrast
Comparison: Previous exam(s).

CLINICAL DATA: The patient was called back for a left breast mass

EXAM:
ULTRASOUND OF THE LEFT BREAST

[Series 1: us breast*left* limited inc axilla · 0.07mm/px · 5 of 5 slices shown]
[im 1/5]
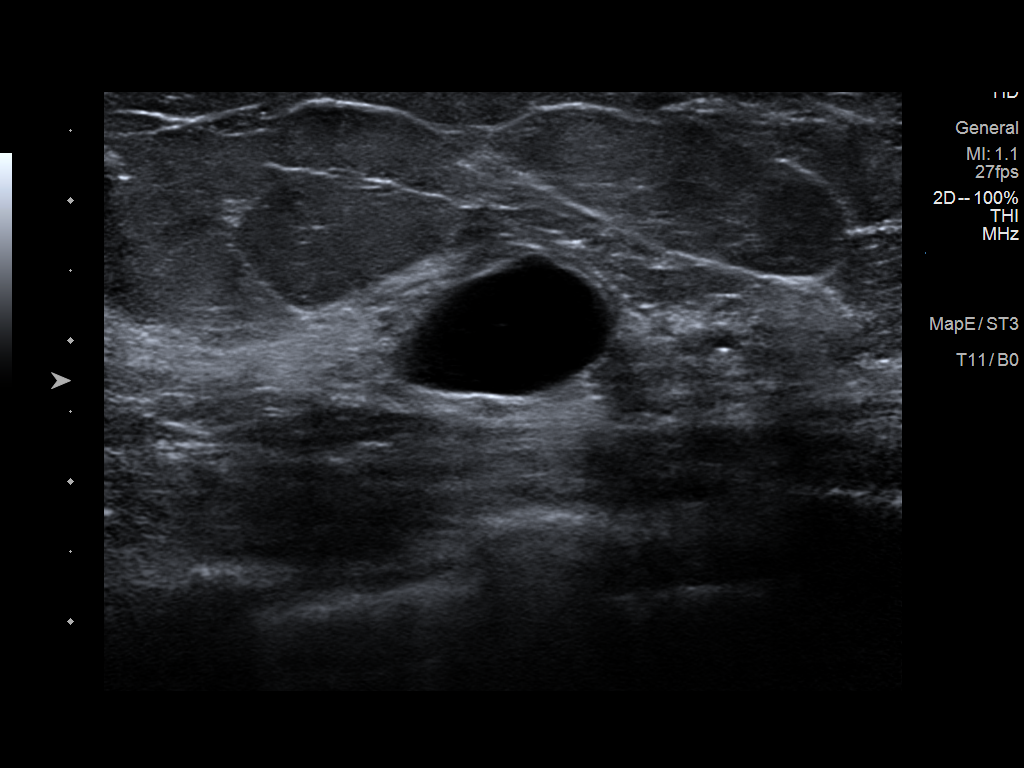
[im 2/5]
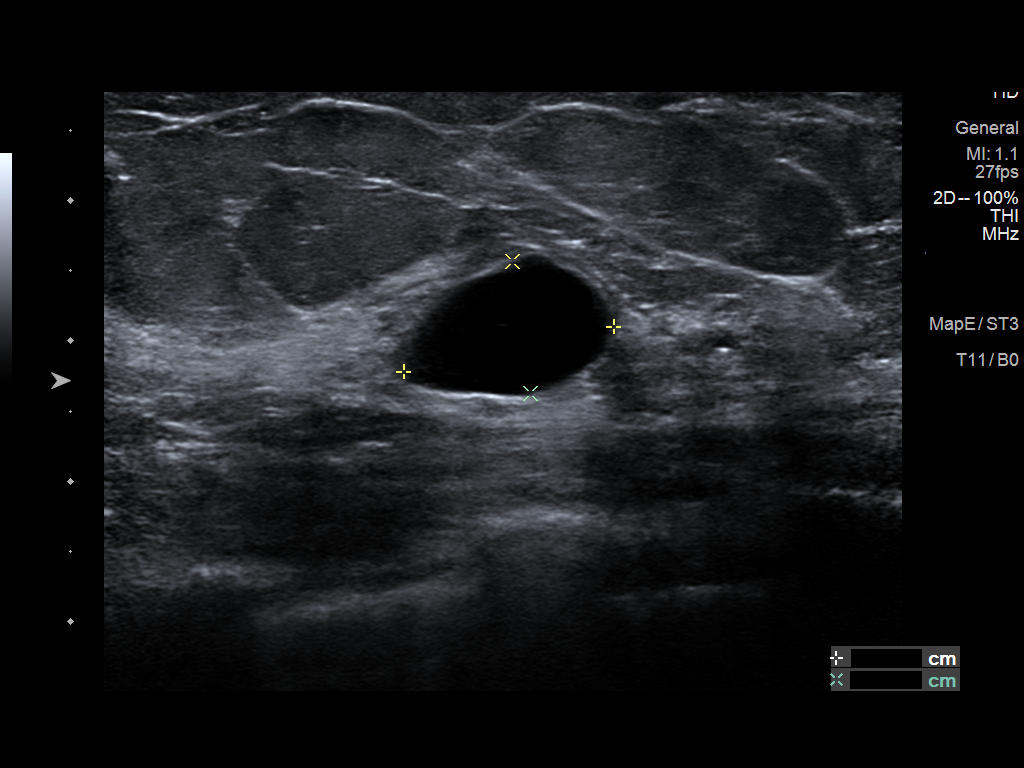
[im 3/5]
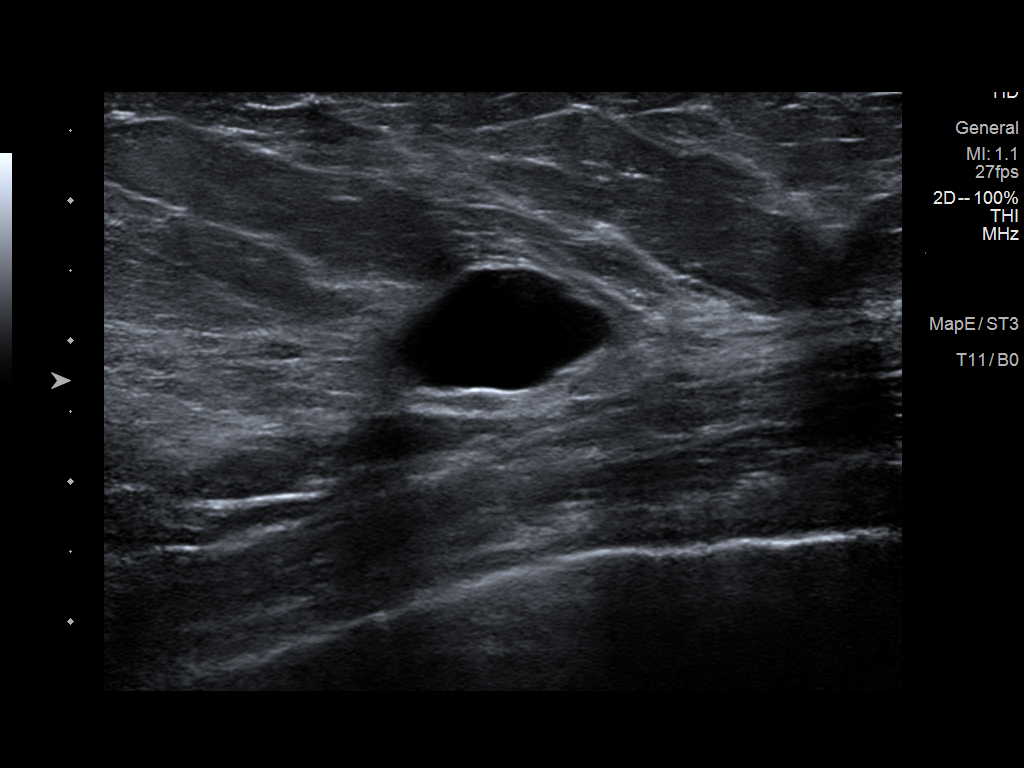
[im 4/5]
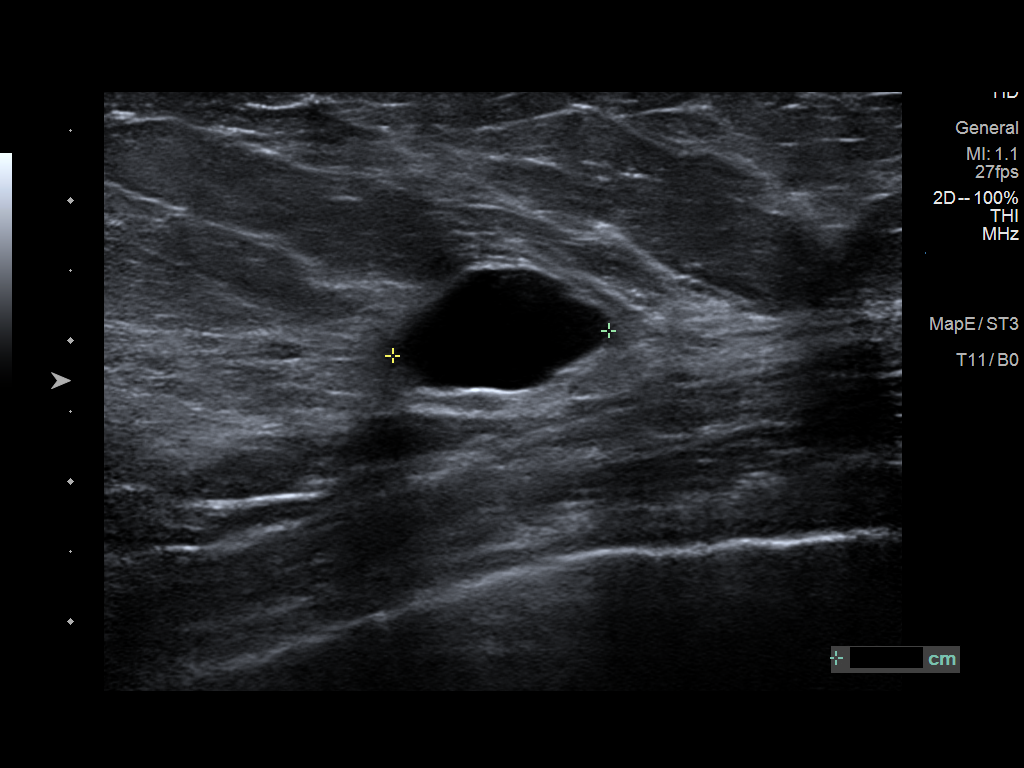
[im 5/5]
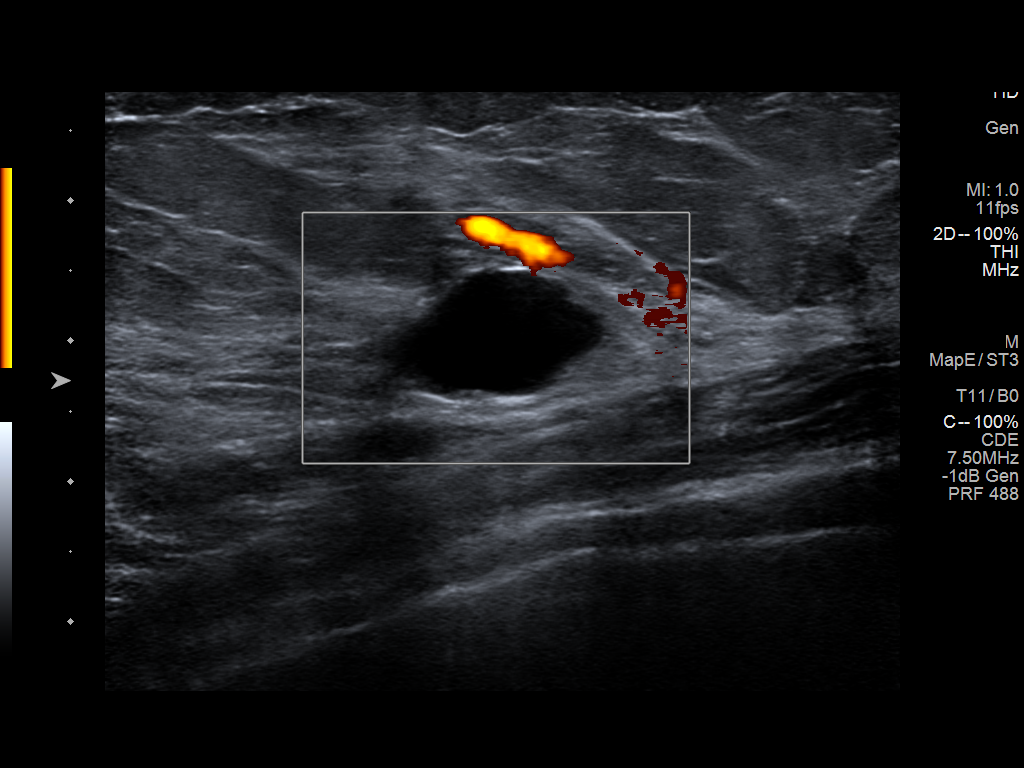

[5 of 5 positions shown; findings below may reference images not displayed]

FINDINGS: On physical exam, no suspicious lumps are identified.

Targeted ultrasound is performed, showing a cyst in the left breast
at 1 o'clock accounting for the mammographic finding.
IMPRESSION: Fibrocystic changes.  No evidence of malignancy.

RECOMMENDATION:
Annual screening mammography.

I have discussed the findings and recommendations with the patient.
If applicable, a reminder letter will be sent to the patient
regarding the next appointment.

BI-RADS CATEGORY  2: Benign.

## 2022-05-09 DIAGNOSIS — M9904 Segmental and somatic dysfunction of sacral region: Secondary | ICD-10-CM | POA: Diagnosis not present

## 2022-05-09 DIAGNOSIS — M9905 Segmental and somatic dysfunction of pelvic region: Secondary | ICD-10-CM | POA: Diagnosis not present

## 2022-05-09 DIAGNOSIS — M5136 Other intervertebral disc degeneration, lumbar region: Secondary | ICD-10-CM | POA: Diagnosis not present

## 2022-05-09 DIAGNOSIS — M9903 Segmental and somatic dysfunction of lumbar region: Secondary | ICD-10-CM | POA: Diagnosis not present

## 2022-05-13 DIAGNOSIS — M9903 Segmental and somatic dysfunction of lumbar region: Secondary | ICD-10-CM | POA: Diagnosis not present

## 2022-05-13 DIAGNOSIS — M9904 Segmental and somatic dysfunction of sacral region: Secondary | ICD-10-CM | POA: Diagnosis not present

## 2022-05-13 DIAGNOSIS — M5136 Other intervertebral disc degeneration, lumbar region: Secondary | ICD-10-CM | POA: Diagnosis not present

## 2022-05-13 DIAGNOSIS — M9905 Segmental and somatic dysfunction of pelvic region: Secondary | ICD-10-CM | POA: Diagnosis not present

## 2022-05-16 DIAGNOSIS — M9903 Segmental and somatic dysfunction of lumbar region: Secondary | ICD-10-CM | POA: Diagnosis not present

## 2022-05-16 DIAGNOSIS — M5136 Other intervertebral disc degeneration, lumbar region: Secondary | ICD-10-CM | POA: Diagnosis not present

## 2022-05-16 DIAGNOSIS — M9904 Segmental and somatic dysfunction of sacral region: Secondary | ICD-10-CM | POA: Diagnosis not present

## 2022-05-16 DIAGNOSIS — M9905 Segmental and somatic dysfunction of pelvic region: Secondary | ICD-10-CM | POA: Diagnosis not present

## 2022-05-20 DIAGNOSIS — M9903 Segmental and somatic dysfunction of lumbar region: Secondary | ICD-10-CM | POA: Diagnosis not present

## 2022-05-20 DIAGNOSIS — M9904 Segmental and somatic dysfunction of sacral region: Secondary | ICD-10-CM | POA: Diagnosis not present

## 2022-05-20 DIAGNOSIS — M9905 Segmental and somatic dysfunction of pelvic region: Secondary | ICD-10-CM | POA: Diagnosis not present

## 2022-05-20 DIAGNOSIS — M5136 Other intervertebral disc degeneration, lumbar region: Secondary | ICD-10-CM | POA: Diagnosis not present

## 2022-05-23 DIAGNOSIS — M9904 Segmental and somatic dysfunction of sacral region: Secondary | ICD-10-CM | POA: Diagnosis not present

## 2022-05-23 DIAGNOSIS — M9903 Segmental and somatic dysfunction of lumbar region: Secondary | ICD-10-CM | POA: Diagnosis not present

## 2022-05-23 DIAGNOSIS — M9905 Segmental and somatic dysfunction of pelvic region: Secondary | ICD-10-CM | POA: Diagnosis not present

## 2022-05-23 DIAGNOSIS — M5136 Other intervertebral disc degeneration, lumbar region: Secondary | ICD-10-CM | POA: Diagnosis not present

## 2022-05-27 DIAGNOSIS — M9905 Segmental and somatic dysfunction of pelvic region: Secondary | ICD-10-CM | POA: Diagnosis not present

## 2022-05-27 DIAGNOSIS — M5136 Other intervertebral disc degeneration, lumbar region: Secondary | ICD-10-CM | POA: Diagnosis not present

## 2022-05-27 DIAGNOSIS — M9904 Segmental and somatic dysfunction of sacral region: Secondary | ICD-10-CM | POA: Diagnosis not present

## 2022-05-27 DIAGNOSIS — M9903 Segmental and somatic dysfunction of lumbar region: Secondary | ICD-10-CM | POA: Diagnosis not present

## 2022-05-30 DIAGNOSIS — M9904 Segmental and somatic dysfunction of sacral region: Secondary | ICD-10-CM | POA: Diagnosis not present

## 2022-05-30 DIAGNOSIS — M5136 Other intervertebral disc degeneration, lumbar region: Secondary | ICD-10-CM | POA: Diagnosis not present

## 2022-05-30 DIAGNOSIS — M9905 Segmental and somatic dysfunction of pelvic region: Secondary | ICD-10-CM | POA: Diagnosis not present

## 2022-05-30 DIAGNOSIS — M9903 Segmental and somatic dysfunction of lumbar region: Secondary | ICD-10-CM | POA: Diagnosis not present

## 2022-06-03 DIAGNOSIS — M9905 Segmental and somatic dysfunction of pelvic region: Secondary | ICD-10-CM | POA: Diagnosis not present

## 2022-06-03 DIAGNOSIS — M9903 Segmental and somatic dysfunction of lumbar region: Secondary | ICD-10-CM | POA: Diagnosis not present

## 2022-06-03 DIAGNOSIS — M5136 Other intervertebral disc degeneration, lumbar region: Secondary | ICD-10-CM | POA: Diagnosis not present

## 2022-06-03 DIAGNOSIS — M9904 Segmental and somatic dysfunction of sacral region: Secondary | ICD-10-CM | POA: Diagnosis not present

## 2022-06-06 DIAGNOSIS — M9904 Segmental and somatic dysfunction of sacral region: Secondary | ICD-10-CM | POA: Diagnosis not present

## 2022-06-06 DIAGNOSIS — M9905 Segmental and somatic dysfunction of pelvic region: Secondary | ICD-10-CM | POA: Diagnosis not present

## 2022-06-06 DIAGNOSIS — M9903 Segmental and somatic dysfunction of lumbar region: Secondary | ICD-10-CM | POA: Diagnosis not present

## 2022-06-06 DIAGNOSIS — M5136 Other intervertebral disc degeneration, lumbar region: Secondary | ICD-10-CM | POA: Diagnosis not present

## 2022-06-10 DIAGNOSIS — M9905 Segmental and somatic dysfunction of pelvic region: Secondary | ICD-10-CM | POA: Diagnosis not present

## 2022-06-10 DIAGNOSIS — M5136 Other intervertebral disc degeneration, lumbar region: Secondary | ICD-10-CM | POA: Diagnosis not present

## 2022-06-10 DIAGNOSIS — M9903 Segmental and somatic dysfunction of lumbar region: Secondary | ICD-10-CM | POA: Diagnosis not present

## 2022-06-10 DIAGNOSIS — M9904 Segmental and somatic dysfunction of sacral region: Secondary | ICD-10-CM | POA: Diagnosis not present

## 2022-06-24 DIAGNOSIS — M9905 Segmental and somatic dysfunction of pelvic region: Secondary | ICD-10-CM | POA: Diagnosis not present

## 2022-06-24 DIAGNOSIS — M9903 Segmental and somatic dysfunction of lumbar region: Secondary | ICD-10-CM | POA: Diagnosis not present

## 2022-06-24 DIAGNOSIS — M9904 Segmental and somatic dysfunction of sacral region: Secondary | ICD-10-CM | POA: Diagnosis not present

## 2022-06-24 DIAGNOSIS — M5136 Other intervertebral disc degeneration, lumbar region: Secondary | ICD-10-CM | POA: Diagnosis not present

## 2022-07-01 DIAGNOSIS — M9904 Segmental and somatic dysfunction of sacral region: Secondary | ICD-10-CM | POA: Diagnosis not present

## 2022-07-01 DIAGNOSIS — M5136 Other intervertebral disc degeneration, lumbar region: Secondary | ICD-10-CM | POA: Diagnosis not present

## 2022-07-01 DIAGNOSIS — M9903 Segmental and somatic dysfunction of lumbar region: Secondary | ICD-10-CM | POA: Diagnosis not present

## 2022-07-01 DIAGNOSIS — M9905 Segmental and somatic dysfunction of pelvic region: Secondary | ICD-10-CM | POA: Diagnosis not present

## 2022-07-08 DIAGNOSIS — M9903 Segmental and somatic dysfunction of lumbar region: Secondary | ICD-10-CM | POA: Diagnosis not present

## 2022-07-08 DIAGNOSIS — M5136 Other intervertebral disc degeneration, lumbar region: Secondary | ICD-10-CM | POA: Diagnosis not present

## 2022-07-08 DIAGNOSIS — M9904 Segmental and somatic dysfunction of sacral region: Secondary | ICD-10-CM | POA: Diagnosis not present

## 2022-07-08 DIAGNOSIS — M9905 Segmental and somatic dysfunction of pelvic region: Secondary | ICD-10-CM | POA: Diagnosis not present

## 2022-07-23 ENCOUNTER — Other Ambulatory Visit: Payer: Self-pay | Admitting: Family Medicine

## 2022-07-23 DIAGNOSIS — Z1231 Encounter for screening mammogram for malignant neoplasm of breast: Secondary | ICD-10-CM

## 2022-07-23 DIAGNOSIS — E7800 Pure hypercholesterolemia, unspecified: Secondary | ICD-10-CM | POA: Diagnosis not present

## 2022-07-23 DIAGNOSIS — Z0000 Encounter for general adult medical examination without abnormal findings: Secondary | ICD-10-CM | POA: Diagnosis not present

## 2022-07-23 DIAGNOSIS — E2839 Other primary ovarian failure: Secondary | ICD-10-CM

## 2022-07-23 DIAGNOSIS — M545 Low back pain, unspecified: Secondary | ICD-10-CM | POA: Diagnosis not present

## 2022-07-23 DIAGNOSIS — Z1211 Encounter for screening for malignant neoplasm of colon: Secondary | ICD-10-CM | POA: Diagnosis not present

## 2022-07-23 DIAGNOSIS — R7303 Prediabetes: Secondary | ICD-10-CM | POA: Diagnosis not present

## 2022-07-24 DIAGNOSIS — M5136 Other intervertebral disc degeneration, lumbar region: Secondary | ICD-10-CM | POA: Diagnosis not present

## 2022-07-24 DIAGNOSIS — M9904 Segmental and somatic dysfunction of sacral region: Secondary | ICD-10-CM | POA: Diagnosis not present

## 2022-07-24 DIAGNOSIS — M9903 Segmental and somatic dysfunction of lumbar region: Secondary | ICD-10-CM | POA: Diagnosis not present

## 2022-07-24 DIAGNOSIS — M9905 Segmental and somatic dysfunction of pelvic region: Secondary | ICD-10-CM | POA: Diagnosis not present

## 2022-08-05 DIAGNOSIS — M9904 Segmental and somatic dysfunction of sacral region: Secondary | ICD-10-CM | POA: Diagnosis not present

## 2022-08-05 DIAGNOSIS — M5136 Other intervertebral disc degeneration, lumbar region: Secondary | ICD-10-CM | POA: Diagnosis not present

## 2022-08-05 DIAGNOSIS — M9905 Segmental and somatic dysfunction of pelvic region: Secondary | ICD-10-CM | POA: Diagnosis not present

## 2022-08-05 DIAGNOSIS — M9903 Segmental and somatic dysfunction of lumbar region: Secondary | ICD-10-CM | POA: Diagnosis not present

## 2022-12-27 ENCOUNTER — Other Ambulatory Visit (HOSPITAL_COMMUNITY): Payer: Self-pay

## 2023-03-10 ENCOUNTER — Ambulatory Visit: Admission: RE | Admit: 2023-03-10 | Payer: Medicare HMO | Source: Ambulatory Visit

## 2023-03-10 DIAGNOSIS — Z1231 Encounter for screening mammogram for malignant neoplasm of breast: Secondary | ICD-10-CM

## 2023-08-08 DIAGNOSIS — E2839 Other primary ovarian failure: Secondary | ICD-10-CM | POA: Diagnosis not present

## 2023-08-08 DIAGNOSIS — M545 Low back pain, unspecified: Secondary | ICD-10-CM | POA: Diagnosis not present

## 2023-08-08 DIAGNOSIS — M25552 Pain in left hip: Secondary | ICD-10-CM | POA: Diagnosis not present

## 2023-08-08 DIAGNOSIS — Z0000 Encounter for general adult medical examination without abnormal findings: Secondary | ICD-10-CM | POA: Diagnosis not present

## 2023-08-08 DIAGNOSIS — Z9181 History of falling: Secondary | ICD-10-CM | POA: Diagnosis not present

## 2023-08-08 DIAGNOSIS — R7303 Prediabetes: Secondary | ICD-10-CM | POA: Diagnosis not present

## 2023-08-08 DIAGNOSIS — E7800 Pure hypercholesterolemia, unspecified: Secondary | ICD-10-CM | POA: Diagnosis not present

## 2023-08-08 DIAGNOSIS — Z1211 Encounter for screening for malignant neoplasm of colon: Secondary | ICD-10-CM | POA: Diagnosis not present

## 2023-08-13 ENCOUNTER — Other Ambulatory Visit: Payer: Self-pay | Admitting: Family Medicine

## 2023-08-13 DIAGNOSIS — Z1231 Encounter for screening mammogram for malignant neoplasm of breast: Secondary | ICD-10-CM

## 2023-08-13 DIAGNOSIS — E2839 Other primary ovarian failure: Secondary | ICD-10-CM

## 2024-04-05 DIAGNOSIS — M546 Pain in thoracic spine: Secondary | ICD-10-CM | POA: Diagnosis not present

## 2024-04-05 DIAGNOSIS — R109 Unspecified abdominal pain: Secondary | ICD-10-CM | POA: Diagnosis not present

## 2024-04-09 DIAGNOSIS — R109 Unspecified abdominal pain: Secondary | ICD-10-CM | POA: Diagnosis not present

## 2024-04-12 ENCOUNTER — Ambulatory Visit
Admission: RE | Admit: 2024-04-12 | Discharge: 2024-04-12 | Disposition: A | Discharge status: Home / Self Care | Payer: Medicare HMO | Source: Ambulatory Visit | Attending: Family Medicine | Admitting: Family Medicine

## 2024-04-12 ENCOUNTER — Other Ambulatory Visit (HOSPITAL_BASED_OUTPATIENT_CLINIC_OR_DEPARTMENT_OTHER)

## 2024-04-12 ENCOUNTER — Other Ambulatory Visit: Payer: Medicare HMO

## 2024-04-12 DIAGNOSIS — Z1231 Encounter for screening mammogram for malignant neoplasm of breast: Secondary | ICD-10-CM

## 2024-09-06 ENCOUNTER — Other Ambulatory Visit: Payer: Self-pay | Admitting: Family Medicine

## 2024-09-06 DIAGNOSIS — R1032 Left lower quadrant pain: Secondary | ICD-10-CM

## 2024-09-07 ENCOUNTER — Inpatient Hospital Stay: Admission: RE | Admit: 2024-09-07 | Discharge: 2024-09-07 | Attending: Family Medicine

## 2024-09-07 DIAGNOSIS — R1032 Left lower quadrant pain: Secondary | ICD-10-CM

## 2024-09-07 MED ORDER — IOPAMIDOL (ISOVUE-370) INJECTION 76%
80.0000 mL | Freq: Once | INTRAVENOUS | Status: AC | PRN
  Administered 2024-09-07: 80 mL via INTRAVENOUS

## 2024-10-11 ENCOUNTER — Other Ambulatory Visit (HOSPITAL_BASED_OUTPATIENT_CLINIC_OR_DEPARTMENT_OTHER)
